# Patient Record
Sex: Female | Born: 1950
Health system: Southern US, Community
[De-identification: ages and names within clinical notes are randomized; demographics above are authoritative.]

## PROBLEM LIST (undated history)

## (undated) DIAGNOSIS — J45909 Unspecified asthma, uncomplicated: Secondary | ICD-10-CM

## (undated) HISTORY — PX: WRIST SURGERY: SHX841

## (undated) HISTORY — PX: FOOT SURGERY: SHX648

---

## 1997-09-13 ENCOUNTER — Other Ambulatory Visit: Admission: RE | Admit: 1997-09-13 | Discharge: 1997-09-13 | Payer: Self-pay | Admitting: Obstetrics & Gynecology

## 1999-04-13 ENCOUNTER — Other Ambulatory Visit: Admission: RE | Admit: 1999-04-13 | Discharge: 1999-04-13 | Payer: Self-pay | Admitting: Obstetrics and Gynecology

## 2001-10-16 ENCOUNTER — Other Ambulatory Visit: Admission: RE | Admit: 2001-10-16 | Discharge: 2001-10-16 | Payer: Self-pay | Admitting: Obstetrics and Gynecology

## 2002-10-15 ENCOUNTER — Ambulatory Visit (HOSPITAL_COMMUNITY): Admission: RE | Admit: 2002-10-15 | Discharge: 2002-10-15 | Payer: Self-pay | Admitting: Orthopedic Surgery

## 2002-10-15 ENCOUNTER — Encounter: Payer: Self-pay | Admitting: Orthopedic Surgery

## 2003-03-03 ENCOUNTER — Other Ambulatory Visit: Admission: RE | Admit: 2003-03-03 | Discharge: 2003-03-03 | Payer: Self-pay | Admitting: Obstetrics and Gynecology

## 2004-03-06 ENCOUNTER — Other Ambulatory Visit: Admission: RE | Admit: 2004-03-06 | Discharge: 2004-03-06 | Payer: Self-pay | Admitting: Obstetrics and Gynecology

## 2007-04-15 ENCOUNTER — Ambulatory Visit (HOSPITAL_BASED_OUTPATIENT_CLINIC_OR_DEPARTMENT_OTHER): Admission: RE | Admit: 2007-04-15 | Discharge: 2007-04-15 | Payer: Self-pay | Admitting: Orthopedic Surgery

## 2009-01-24 ENCOUNTER — Ambulatory Visit (HOSPITAL_BASED_OUTPATIENT_CLINIC_OR_DEPARTMENT_OTHER): Admission: RE | Admit: 2009-01-24 | Discharge: 2009-01-24 | Payer: Self-pay | Admitting: Orthopedic Surgery

## 2010-01-12 ENCOUNTER — Encounter: Admission: RE | Admit: 2010-01-12 | Discharge: 2010-01-12 | Payer: Self-pay | Admitting: Obstetrics and Gynecology

## 2010-06-13 LAB — POCT HEMOGLOBIN-HEMACUE: Hemoglobin: 13.8 g/dL (ref 12.0–15.0)

## 2010-07-24 NOTE — Op Note (Signed)
NAMEWAYNETTE, TOWERS NO.:  [id number]   MEDICAL RECORD NO.:  [id number]          PATIENT TYPE:  AMB   LOCATION:  DSC                          FACILITY:  [facility]   PHYSICIAN:  Cindee Salt, M.D.       DATE OF BIRTH:  03/04/1951   DATE OF PROCEDURE:  04/15/2007  DATE OF DISCHARGE:                               OPERATIVE REPORT   PREOPERATIVE DIAGNOSIS:  Distal radius fracture, comminuted intra-  articular, left wrist with carpal tunnel syndrome.   POSTOPERATIVE DIAGNOSIS:  Distal radius fracture, comminuted intra-  articular, left wrist with carpal tunnel syndrome.   OPERATION:  Open reduction internal fixation with Orthofix plate, carpal  tunnel release left hand.   SURGEON:  Cindee Salt, M.D.   ASSISTANT:  None.   ANESTHESIA:  Supraclavicular block, general anesthesia   ANESTHESIOLOGIST:  Sampson Goon.   HISTORY:  The patient is a 60 year old female who suffered a fall  fracture of the left distal radius.  She has dense numbness and tingling  of her fingers two days following the fracture.  She is desirous of  having this released along with open reduction internal fixation of  comminuted displaced intra-articular fracture of her left distal radius.  She is aware of risks and complications including infection, recurrence,  injury to arteries, nerves, tendons, incomplete relief of symptoms,  dystrophy, loss of fixation, penetration of the joint, nonunion, failure  of the fixation device.  In the preoperative area the patient is seen  and the extremity marked by both the patient and surgeon, questions  answered.  The antibiotic given.   PROCEDURE:  The patient is brought to the operating room.  Following a  supraclavicular block general anesthesia was given.  She was prepped  using DuraPrep, supine position, left arm free.  A volar radial incision  was made, carried down through subcutaneous tissue.  Bleeders were  electrocauterized.  Dissection carried down  between the flexor carpi  radialis, the radial artery.  The pronator quadratus was identified.  The fracture fragments were noted have penetrated through the pronator  quadratus were trapped by the muscle fibers.  The pronator was then  elevated.  A portion of the flexor pollicis longus was also elevated  from the radius.  The distal radius was identified and the fracture was  then manipulated reduced allowing interdigitation.  A standard sized  left Orthofix volar plate was then selected.  This was placed, pinned in  position.  X-rays confirmed positioning of the plate.  The sliding screw  hole was then made with 2.5 mm drill. A 12-mm screw was inserted after  measuring.  This fixed the plate in position.  The distal screws were  then placed.  The most ulnar screw was placed.  This was drilled with a  guide, measured found to be 18 to 20 mm.  This was then placed as a  nonlocking screw to bring the fracture down to the plate.  X-rays  confirmed that it was extra-articular, that it maintained the fracture  in position.  The remaining distal screws were each placed.  These were  all locking and measured between 18 and 20 mm.  These were placed.  X-  rays AP lateral and oblique revealed that none had penetrated into the  joint surface.  Fracture was maintained in position.  The remaining  proximal screws were then placed.  These were 3.5 mm screws and both  measured 12 mm.  X-rays confirmed position of plate, screws with  reduction of the fracture and maintenance of length and angle.  The  wound was irrigated.  Pronator quadratus was then repaired with figure-  of-eight 4-0 Vicryl stay sutures.  The brachial radialis had been  released with the Z-plasty from the radial styloid.  This was used to  reapproximate the pronator quadratus.  This was done after irrigation.  The subcutaneous tissue was then closed interrupted 4-0 Vicryl and skin  with interrupted 5-0 Vicryl Rapide sutures.  Separate  incision was then  made in the palm longitudinally carried down through subcutaneous  tissue.  Bleeders were electrocauterized, palmar fascia split,  superficial palmar arch identified, flexor tendon of the ring and little  finger identified to the ulnar side of median nerve.  The carpal  retinaculum was incised with sharp dissection.  A right-angle and Sewall  retractor placed through skin and forearm fascia.  The fascia released  for approximately a centimeter and half proximal to the wrist crease  under direct vision.  Canal was explored.  Area compression of the nerve  was apparent.  No further lesions were identified.  The wound was  irrigated.  The skin closed interrupted 5-0 Vicryl Rapide sutures.  Sterile compressive dressing dorsal palmar splint applied.  On Deflation  of tourniquet all fingers immediately pinked.  She was taken to the  recovery room for observation in satisfactory condition.  She will be  discharged home to return to [facility] in one week on  Vicodin.           ______________________________  Cindee Salt, M.D.     GK/MEDQ  D:  04/15/2007  T:  04/16/2007  Job:  [id number]

## 2010-11-30 LAB — POCT HEMOGLOBIN-HEMACUE: Hemoglobin: 13.5

## 2014-07-17 ENCOUNTER — Emergency Department (HOSPITAL_COMMUNITY)
Admission: EM | Admit: 2014-07-17 | Discharge: 2014-07-18 | Disposition: A | Discharge status: Home / Self Care | Payer: BLUE CROSS/BLUE SHIELD | Attending: Emergency Medicine | Admitting: Emergency Medicine

## 2014-07-17 ENCOUNTER — Emergency Department (HOSPITAL_COMMUNITY): Payer: BLUE CROSS/BLUE SHIELD

## 2014-07-17 ENCOUNTER — Encounter (HOSPITAL_COMMUNITY): Payer: Self-pay | Admitting: Emergency Medicine

## 2014-07-17 DIAGNOSIS — Y998 Other external cause status: Secondary | ICD-10-CM | POA: Insufficient documentation

## 2014-07-17 DIAGNOSIS — S8991XA Unspecified injury of right lower leg, initial encounter: Secondary | ICD-10-CM | POA: Insufficient documentation

## 2014-07-17 DIAGNOSIS — W109XXA Fall (on) (from) unspecified stairs and steps, initial encounter: Secondary | ICD-10-CM | POA: Diagnosis not present

## 2014-07-17 DIAGNOSIS — Y9301 Activity, walking, marching and hiking: Secondary | ICD-10-CM | POA: Insufficient documentation

## 2014-07-17 DIAGNOSIS — M25561 Pain in right knee: Secondary | ICD-10-CM

## 2014-07-17 DIAGNOSIS — Y92009 Unspecified place in unspecified non-institutional (private) residence as the place of occurrence of the external cause: Secondary | ICD-10-CM | POA: Insufficient documentation

## 2014-07-17 DIAGNOSIS — Z880 Allergy status to penicillin: Secondary | ICD-10-CM | POA: Diagnosis not present

## 2014-07-17 DIAGNOSIS — G8929 Other chronic pain: Secondary | ICD-10-CM | POA: Diagnosis not present

## 2014-07-17 DIAGNOSIS — J45909 Unspecified asthma, uncomplicated: Secondary | ICD-10-CM | POA: Insufficient documentation

## 2014-07-17 HISTORY — DX: Unspecified asthma, uncomplicated: J45.909

## 2014-07-17 MED ORDER — OXYCODONE-ACETAMINOPHEN 5-325 MG PO TABS
2.0000 | ORAL_TABLET | Freq: Once | ORAL | Status: AC
  Administered 2014-07-17: 2 via ORAL
  Filled 2014-07-17: qty 2

## 2014-07-17 MED ORDER — OXYCODONE-ACETAMINOPHEN 5-325 MG PO TABS: 2.0000 | ORAL_TABLET | Freq: Four times a day (QID) | ORAL | Status: AC | PRN

## 2014-07-17 MED ORDER — ENOXAPARIN SODIUM 80 MG/0.8ML ~~LOC~~ SOLN
1.0000 mg/kg | Freq: Once | SUBCUTANEOUS | Status: AC
  Administered 2014-07-17: 80 mg via SUBCUTANEOUS
  Filled 2014-07-17: qty 0.8

## 2014-07-17 NOTE — Discharge Instructions (Signed)
Knee Pain °The knee is the complex joint between your thigh and your lower leg. It is made up of bones, tendons, ligaments, and cartilage. The bones that make up the knee are: °· The femur in the thigh. °· The tibia and fibula in the lower leg. °· The patella or kneecap riding in the groove on the lower femur. °CAUSES  °Knee pain is a common complaint with many causes. A few of these causes are: °· Injury, such as: °¨ A ruptured ligament or tendon injury. °¨ Torn cartilage. °· Medical conditions, such as: °¨ Gout °¨ Arthritis °¨ Infections °· Overuse, over training, or overdoing a physical activity. °Knee pain can be minor or severe. Knee pain can accompany debilitating injury. Minor knee problems often respond well to self-care measures or get well on their own. More serious injuries may need medical intervention or even surgery. °SYMPTOMS °The knee is complex. Symptoms of knee problems can vary widely. Some of the problems are: °· Pain with movement and weight bearing. °· Swelling and tenderness. °· Buckling of the knee. °· Inability to straighten or extend your knee. °· Your knee locks and you cannot straighten it. °· Warmth and redness with pain and fever. °· Deformity or dislocation of the kneecap. °DIAGNOSIS  °Determining what is wrong may be very straight forward such as when there is an injury. It can also be challenging because of the complexity of the knee. Tests to make a diagnosis may include: °· Your caregiver taking a history and doing a physical exam. °· Routine X-rays can be used to rule out other problems. X-rays will not reveal a cartilage tear. Some injuries of the knee can be diagnosed by: °¨ Arthroscopy a surgical technique by which a small video camera is inserted through tiny incisions on the sides of the knee. This procedure is used to examine and repair internal knee joint problems. Tiny instruments can be used during arthroscopy to repair the torn knee cartilage (meniscus). °¨ Arthrography  is a radiology technique. A contrast liquid is directly injected into the knee joint. Internal structures of the knee joint then become visible on X-ray film. °¨ An MRI scan is a non X-ray radiology procedure in which magnetic fields and a computer produce two- or three-dimensional images of the inside of the knee. Cartilage tears are often visible using an MRI scanner. MRI scans have largely replaced arthrography in diagnosing cartilage tears of the knee. °· Blood work. °· Examination of the fluid that helps to lubricate the knee joint (synovial fluid). This is done by taking a sample out using a needle and a syringe. °TREATMENT °The treatment of knee problems depends on the cause. Some of these treatments are: °· Depending on the injury, proper casting, splinting, surgery, or physical therapy care will be needed. °· Give yourself adequate recovery time. Do not overuse your joints. If you begin to get sore during workout routines, back off. Slow down or do fewer repetitions. °· For repetitive activities such as cycling or running, maintain your strength and nutrition. °· Alternate muscle groups. For example, if you are a weight lifter, work the upper body on one day and the lower body the next. °· Either tight or weak muscles do not give the proper support for your knee. Tight or weak muscles do not absorb the stress placed on the knee joint. Keep the muscles surrounding the knee strong. °· Take care of mechanical problems. °¨ If you have flat feet, orthotics or special shoes may help.   See your caregiver if you need help. °¨ Arch supports, sometimes with wedges on the inner or outer aspect of the heel, can help. These can shift pressure away from the side of the knee most bothered by osteoarthritis. °¨ A brace called an "unloader" brace also may be used to help ease the pressure on the most arthritic side of the knee. °· If your caregiver has prescribed crutches, braces, wraps or ice, use as directed. The acronym  for this is PRICE. This means protection, rest, ice, compression, and elevation. °· Nonsteroidal anti-inflammatory drugs (NSAIDs), can help relieve pain. But if taken immediately after an injury, they may actually increase swelling. Take NSAIDs with food in your stomach. Stop them if you develop stomach problems. Do not take these if you have a history of ulcers, stomach pain, or bleeding from the bowel. Do not take without your caregiver's approval if you have problems with fluid retention, heart failure, or kidney problems. °· For ongoing knee problems, physical therapy may be helpful. °· Glucosamine and chondroitin are over-the-counter dietary supplements. Both may help relieve the pain of osteoarthritis in the knee. These medicines are different from the usual anti-inflammatory drugs. Glucosamine may decrease the rate of cartilage destruction. °· Injections of a corticosteroid drug into your knee joint may help reduce the symptoms of an arthritis flare-up. They may provide pain relief that lasts a few months. You may have to wait a few months between injections. The injections do have a small increased risk of infection, water retention, and elevated blood sugar levels. °· Hyaluronic acid injected into damaged joints may ease pain and provide lubrication. These injections may work by reducing inflammation. A series of shots may give relief for as long as 6 months. °· Topical painkillers. Applying certain ointments to your skin may help relieve the pain and stiffness of osteoarthritis. Ask your pharmacist for suggestions. Many over the-counter products are approved for temporary relief of arthritis pain. °· In some countries, doctors often prescribe topical NSAIDs for relief of chronic conditions such as arthritis and tendinitis. A review of treatment with NSAID creams found that they worked as well as oral medications but without the serious side effects. °PREVENTION °· Maintain a healthy weight. Extra pounds  put more strain on your joints. °· Get strong, stay limber. Weak muscles are a common cause of knee injuries. Stretching is important. Include flexibility exercises in your workouts. °· Be smart about exercise. If you have osteoarthritis, chronic knee pain or recurring injuries, you may need to change the way you exercise. This does not mean you have to stop being active. If your knees ache after jogging or playing basketball, consider switching to swimming, water aerobics, or other low-impact activities, at least for a few days a week. Sometimes limiting high-impact activities will provide relief. °· Make sure your shoes fit well. Choose footwear that is right for your sport. °· Protect your knees. Use the proper gear for knee-sensitive activities. Use kneepads when playing volleyball or laying carpet. Buckle your seat belt every time you drive. Most shattered kneecaps occur in car accidents. °· Rest when you are tired. °SEEK MEDICAL CARE IF:  °You have knee pain that is continual and does not seem to be getting better.  °SEEK IMMEDIATE MEDICAL CARE IF:  °Your knee joint feels hot to the touch and you have a high fever. °MAKE SURE YOU:  °· Understand these instructions. °· Will watch your condition. °· Will get help right away if you are not   doing well or get worse. Document Released: 12/23/2006 Document Revised: 05/20/2011 Document Reviewed: 12/23/2006 [facility] Patient Information 2015 [place], [place]. This information is not intended to replace advice given to you by your health care provider. Make sure you discuss any questions you have with your health care provider. Deep Vein Thrombosis A deep vein thrombosis (DVT) is a blood clot that develops in the deep, larger veins of the leg, arm, or pelvis. These are more dangerous than clots that might form in veins near the surface of the body. A DVT can lead to serious and even life-threatening complications if the clot breaks off and travels in the bloodstream  to the lungs.  A DVT can damage the valves in your leg veins so that instead of flowing upward, the blood pools in the lower leg. This is called post-thrombotic syndrome, and it can result in pain, swelling, discoloration, and sores on the leg. CAUSES Usually, several things contribute to the formation of blood clots. Contributing factors include:  The flow of blood slows down.  The inside of the vein is damaged in some way.  You have a condition that makes blood clot more easily. RISK FACTORS Some people are more likely than others to develop blood clots. Risk factors include:   Smoking.  Being overweight (obese).  Sitting or lying still for a long time. This includes long-distance travel, paralysis, or recovery from an illness or surgery. Other factors that increase risk are:   Older age, especially over 63 years of age.  Having a family history of blood clots or if you have already had a blot clot.  Having major or lengthy surgery. This is especially true for surgery on the hip, knee, or belly (abdomen). Hip surgery is particularly high risk.  Having a long, thin tube (catheter) placed inside a vein during a medical procedure.  Breaking a hip or leg.  Having cancer or cancer treatment.  Pregnancy and childbirth.  Hormone changes make the blood clot more easily during pregnancy.  The fetus puts pressure on the veins of the pelvis.  There is a risk of injury to veins during delivery or a caesarean delivery. The risk is highest just after childbirth.  Medicines containing the female hormone estrogen. This includes birth control pills and hormone replacement therapy.  Other circulation or heart problems.  SIGNS AND SYMPTOMS When a clot forms, it can either partially or totally block the blood flow in that vein. Symptoms of a DVT can include:  Swelling of the leg or arm, especially if one side is much worse.  Warmth and redness of the leg or arm, especially if one side  is much worse.  Pain in an arm or leg. If the clot is in the leg, symptoms may be more noticeable or worse when standing or walking. The symptoms of a DVT that has traveled to the lungs (pulmonary embolism, PE) usually start suddenly and include:  Shortness of breath.  Coughing.  Coughing up blood or blood-tinged mucus.  Chest pain. The chest pain is often worse with deep breaths.  Rapid heartbeat. Anyone with these symptoms should get emergency medical treatment right away. Do not wait to see if the symptoms will go away. Call your local emergency services (911 in the [place] if you have these symptoms. Do not drive yourself to the hospital. DIAGNOSIS If a DVT is suspected, your health care provider will take a full medical history and perform a physical exam. Tests that also may be required include:  Blood tests, including studies of the clotting properties of the blood.  Ultrasound to see if you have clots in your legs or lungs.  X-rays to show the flow of blood when dye is injected into the veins (venogram).  Studies of your lungs if you have any chest symptoms. PREVENTION  Exercise the legs regularly. Take a brisk 30-minute walk every day.  Maintain a weight that is appropriate for your height.  Avoid sitting or lying in bed for long periods of time without moving your legs.  Women, particularly those over the age of 31 years, should consider the risks and benefits of taking estrogen medicines, including birth control pills.  Do not smoke, especially if you take estrogen medicines.  Long-distance travel can increase your risk of DVT. You should exercise your legs by walking or pumping the muscles every hour.  Many of the risk factors above relate to situations that exist with hospitalization, either for illness, injury, or elective surgery. Prevention may include medical and nonmedical measures.  Your health care provider will assess you for the need for venous  thromboembolism prevention when you are admitted to the hospital. If you are having surgery, your surgeon will assess you the day of or day after surgery. TREATMENT Once identified, a DVT can be treated. It can also be prevented in some circumstances. Once you have had a DVT, you may be at increased risk for a DVT in the future. The most common treatment for DVT is blood-thinning (anticoagulant) medicine, which reduces the blood's tendency to clot. Anticoagulants can stop new blood clots from forming and stop old clots from growing. They cannot dissolve existing clots. Your body does this by itself over time. Anticoagulants can be given by mouth, through an IV tube, or by injection. Your health care provider will determine the best program for you. Other medicines or treatments that may be used are:  Heparin or related medicines (low molecular weight heparin) are often the first treatment for a blood clot. They act quickly. However, they cannot be taken orally and must be given either in shot form or by IV tube.  Heparin can cause a fall in a component of blood that stops bleeding and forms blood clots (platelets). You will be monitored with blood tests to be sure this does not occur.  Warfarin is an anticoagulant that can be swallowed. It takes a few days to start working, so usually heparin or related medicines are used in combination. Once warfarin is working, heparin is usually stopped.  Factor Xa inhibitor medicines, such as rivaroxaban and apixaban, also reduce blood clotting. These medicines are taken orally and can often be used without heparin or related medicines.  Less commonly, clot dissolving drugs (thrombolytics) are used to dissolve a DVT. They carry a high risk of bleeding, so they are used mainly in severe cases where your life or a part of your body is threatened.  Very rarely, a blood clot in the leg needs to be removed surgically.  If you are unable to take anticoagulants, your  health care provider may arrange for you to have a filter placed in a main vein in your abdomen. This filter prevents clots from traveling to your lungs. HOME CARE INSTRUCTIONS  Take all medicines as directed by your health care provider.  Learn as much as you can about DVT.  Wear a medical alert bracelet or carry a medical alert card.  Ask your health care provider how soon you can go back to  normal activities. It is important to stay active to prevent blood clots. If you are on anticoagulant medicine, avoid contact sports.  It is very important to exercise. This is especially important while traveling, sitting, or standing for long periods of time. Exercise your legs by walking or by tightening and relaxing your leg muscles regularly. Take frequent walks.  You may need to wear compression stockings. These are tight elastic stockings that apply pressure to the lower legs. This pressure can help keep the blood in the legs from clotting. Taking Warfarin Warfarin is a daily medicine that is taken by mouth. Your health care provider will advise you on the length of treatment (usually 3-6 months, sometimes lifelong). If you take warfarin:  Understand how to take warfarin and foods that can affect how warfarin works in Veterinary surgeon.  Too much and too little warfarin are both dangerous. Too much warfarin increases the risk of bleeding. Too little warfarin continues to allow the risk for blood clots. Warfarin and Regular Blood Testing While taking warfarin, you will need to have regular blood tests to measure your blood clotting time. These blood tests usually include both the prothrombin time (PT) and international normalized ratio (INR) tests. The PT and INR results allow your health care provider to adjust your dose of warfarin. It is very important that you have your PT and INR tested as often as directed by your health care provider.  Warfarin and Your Diet Avoid major changes in your diet, or  notify your health care provider before changing your diet. Arrange a visit with a registered dietitian to answer your questions. Many foods, especially foods high in vitamin K, can interfere with warfarin and affect the PT and INR results. You should eat a consistent amount of foods high in vitamin K. Foods high in vitamin K include:   Spinach, kale, broccoli, cabbage, collard and turnip greens, Brussels sprouts, peas, cauliflower, seaweed, and parsley.  Beef and pork liver.  Green tea.  Soybean oil. Warfarin with Other Medicines Many medicines can interfere with warfarin and affect the PT and INR results. You must:  Tell your health care provider about any and all medicines, vitamins, and supplements you take, including aspirin and other over-the-counter anti-inflammatory medicines. Be especially cautious with aspirin and anti-inflammatory medicines. Ask your health care provider before taking these.  Do not take or discontinue any prescribed or over-the-counter medicine except on the advice of your health care provider or pharmacist. Warfarin Side Effects Warfarin can have side effects, such as easy bruising and difficulty stopping bleeding. Ask your health care provider or pharmacist about other side effects of warfarin. You will need to:  Hold pressure over cuts for longer than usual.  Notify your dentist and other health care providers that you are taking warfarin before you undergo any procedures where bleeding may occur. Warfarin with Alcohol and Tobacco   Drinking alcohol frequently can increase the effect of warfarin, leading to excess bleeding. It is best to avoid alcoholic drinks or to consume only very small amounts while taking warfarin. Notify your health care provider if you change your alcohol intake.   Do not use any tobacco products including cigarettes, chewing tobacco, or electronic cigarettes. If you smoke, quit. Ask your health care provider for help with quitting  smoking. Alternative Medicines to Warfarin: Factor Xa Inhibitor Medicines  These blood-thinning medicines are taken by mouth, usually for several weeks or longer. It is important to take the medicine every single day at the  same time each day.  There are no regular blood tests required when using these medicines.  There are fewer food and drug interactions than with warfarin.  The side effects of this class of medicine are similar to those of warfarin, including excessive bruising or bleeding. Ask your health care provider or pharmacist about other potential side effects. SEEK MEDICAL CARE IF:  You notice a rapid heartbeat.  You feel weaker or more tired than usual.  You feel faint.  You notice increased bruising.  You feel your symptoms are not getting better in the time expected.  You believe you are having side effects of medicine. SEEK IMMEDIATE MEDICAL CARE IF:  You have chest pain.  You have trouble breathing.  You have new or increased swelling or pain in one leg.  You cough up blood.  You notice blood in vomit, in a bowel movement, or in urine. MAKE SURE YOU:  Understand these instructions.  Will watch your condition.  Will get help right away if you are not doing well or get worse. Document Released: 02/25/2005 Document Revised: 07/12/2013 Document Reviewed: 11/02/2012 [facility] Patient Information 2015 [place], [place]. This information is not intended to replace advice given to you by your health care provider. Make sure you discuss any questions you have with your health care provider.

## 2014-07-17 NOTE — ED Notes (Signed)
Pt. reports chronic right knee pain ( uses a walker ) worse this evening after she missed her step and almost fell at home this evening , presents with posterior right knee pain radiating to lower leg , pain increases with movement /weight bearing.

## 2014-07-17 NOTE — ED Provider Notes (Signed)
CSN: [id number]     Arrival date & time 07/17/14  2206 History  This chart was scribed for non-physician practitioner Lorre Munroe, PA, working with Malvin Johns, MD, by Eustaquio Maize, ED Scribe. This patient was seen in room E47C/E47C and the patient's care was started at 11:02 PM.    Chief Complaint  Patient presents with  . Knee Pain   The history is provided by the patient. No language interpreter was used.     HPI Comments: Alexis Tate is a 64 y.o. female with hx chronic right knee pain who presents to the Emergency Department complaining of right knee that began 1 month ago, exacerbated today.  This evening pt was walking up stairs and fell, causing increased pain to the area. She mentions that her right leg has been swollen for some time but she notes increased swelling since the fall. Pt was seen at Urgent Care for knee pain and swelling in the past. She has also seen in orthopedic, Dr. Huntley Estelle the pain and received cortisone shot. She mentions that she has had to walk with a cane since getting the shot. Pt has not had an MRI of her knee. Her orthopedic believes this is the next step. Pt is on her feet all day at work walking on cement floors. She mentions that she had a tick bite to her left leg and is concerned that her symptoms could be related to to Lyme disease. Fhx DVTs with pt's mother. She denies fever, chills, weakness or numbness, or any other symptoms.    Past Medical History  Diagnosis Date  . Asthma    Past Surgical History  Procedure Laterality Date  . Wrist surgery    . Foot surgery     No family history on file. History  Substance Use Topics  . Smoking status: Never Smoker   . Smokeless tobacco: Not on file  . Alcohol Use: No   OB History    No data available     Review of Systems  Constitutional: Negative for fever and chills.  Respiratory: Negative for cough and shortness of breath.   Cardiovascular: Negative for chest pain.  Gastrointestinal:  Negative for nausea and vomiting.  Musculoskeletal: Positive for joint swelling (Right lower extremity swelling. ), arthralgias (Right knee pain. ) and gait problem. Negative for myalgias.  Skin: Negative for color change.  Neurological: Negative for syncope, weakness and numbness.  Psychiatric/Behavioral: Negative for confusion.      Allergies  Amoxicillin; Erythromycin; and Penicillins  Home Medications   Prior to Admission medications   Not on File   Triage Vitals: BP 169/74 mmHg  Temp(Src) 99.5 F (37.5 C) (Oral)  Resp 14  Ht 5\' 2"  (1.575 m)  Wt 180 lb (81.647 kg)  BMI 32.91 kg/m2  SpO2 99%   Physical Exam  Constitutional: She is oriented to person, place, and time. She appears well-developed and well-nourished. No distress.  HENT:  Head: Normocephalic and atraumatic.  Eyes: Conjunctivae and EOM are normal.  Neck: Neck supple. No tracheal deviation present.  Cardiovascular: Normal rate.   Pulmonary/Chest: Effort normal. No respiratory distress.  Musculoskeletal: Normal range of motion.  Right knee mildly tender to palpation, moderately swollen, no bony abnormality or deformity, moderate calf tenderness, no erythema, no evidence of septic joint  Neurological: She is alert and oriented to person, place, and time.  Skin: Skin is warm and dry.  Psychiatric: She has a normal mood and affect. Her behavior is normal.  Nursing note  and vitals reviewed.   ED Course  Procedures (including critical care time)  DIAGNOSTIC STUDIES: Oxygen Saturation is 99% on RA, normal by my interpretation.    COORDINATION OF CARE: 11:09 PM-Discussed treatment plan which includes ultrasound in the morning and Lovenox in the ED with pt at bedside and pt agreed to plan.   Labs Review Labs Reviewed - No data to display  Imaging Review No results found.   EKG Interpretation None      MDM   Final diagnoses:  Knee pain, chronic, right   Patient with acute on chronic right knee  pain. No obvious deformity or abnormality, mildly swollen. Some calf tenderness. Some concern for DVT. Family history of DVT. Will give Lovenox, and recommend ultrasound morning. Plain films are negative. Ultrasound is negative, recommend orthopedic follow-up. Patient understands and agrees with the plan. She is stable for discharge.  I personally performed the services described in this documentation, which was scribed in my presence. The recorded information has been reviewed and is accurate.      Montine Circle, PA-C 07/17/14 [id number]  Malvin Johns, MD 07/17/14 [phone]

## 2014-07-18 ENCOUNTER — Ambulatory Visit (HOSPITAL_COMMUNITY)
Admission: RE | Admit: 2014-07-18 | Discharge: 2014-07-18 | Disposition: A | Discharge status: Home / Self Care | Payer: BLUE CROSS/BLUE SHIELD | Source: Ambulatory Visit | Attending: Emergency Medicine | Admitting: Emergency Medicine

## 2014-07-18 DIAGNOSIS — M79609 Pain in unspecified limb: Secondary | ICD-10-CM

## 2014-07-18 DIAGNOSIS — M79604 Pain in right leg: Secondary | ICD-10-CM | POA: Diagnosis present

## 2017-06-18 DIAGNOSIS — J309 Allergic rhinitis, unspecified: Secondary | ICD-10-CM | POA: Diagnosis not present

## 2017-06-18 DIAGNOSIS — J019 Acute sinusitis, unspecified: Secondary | ICD-10-CM | POA: Diagnosis not present

## 2017-06-18 DIAGNOSIS — Z6834 Body mass index (BMI) 34.0-34.9, adult: Secondary | ICD-10-CM | POA: Diagnosis not present

## 2017-10-13 DIAGNOSIS — N3941 Urge incontinence: Secondary | ICD-10-CM | POA: Diagnosis not present

## 2017-10-13 DIAGNOSIS — N904 Leukoplakia of vulva: Secondary | ICD-10-CM | POA: Diagnosis not present

## 2017-11-12 DIAGNOSIS — N941 Unspecified dyspareunia: Secondary | ICD-10-CM | POA: Diagnosis not present

## 2017-11-12 DIAGNOSIS — L900 Lichen sclerosus et atrophicus: Secondary | ICD-10-CM | POA: Diagnosis not present

## 2017-11-12 DIAGNOSIS — N393 Stress incontinence (female) (male): Secondary | ICD-10-CM | POA: Diagnosis not present

## 2018-02-25 DIAGNOSIS — Z01419 Encounter for gynecological examination (general) (routine) without abnormal findings: Secondary | ICD-10-CM | POA: Diagnosis not present

## 2018-02-25 DIAGNOSIS — Z1231 Encounter for screening mammogram for malignant neoplasm of breast: Secondary | ICD-10-CM | POA: Diagnosis not present

## 2018-02-26 ENCOUNTER — Other Ambulatory Visit: Payer: Self-pay | Admitting: Obstetrics

## 2018-02-26 DIAGNOSIS — R928 Other abnormal and inconclusive findings on diagnostic imaging of breast: Secondary | ICD-10-CM

## 2018-03-06 ENCOUNTER — Ambulatory Visit
Admission: RE | Admit: 2018-03-06 | Discharge: 2018-03-06 | Disposition: A | Discharge status: Home / Self Care | Payer: BLUE CROSS/BLUE SHIELD | Source: Ambulatory Visit | Attending: Obstetrics | Admitting: Obstetrics

## 2018-03-06 ENCOUNTER — Ambulatory Visit
Admission: RE | Admit: 2018-03-06 | Discharge: 2018-03-06 | Disposition: A | Discharge status: Home / Self Care | Payer: Medicare HMO | Source: Ambulatory Visit | Attending: Obstetrics | Admitting: Obstetrics

## 2018-03-06 ENCOUNTER — Other Ambulatory Visit: Payer: Self-pay | Admitting: Obstetrics

## 2018-03-06 DIAGNOSIS — R928 Other abnormal and inconclusive findings on diagnostic imaging of breast: Secondary | ICD-10-CM

## 2018-03-06 DIAGNOSIS — N6323 Unspecified lump in the left breast, lower outer quadrant: Secondary | ICD-10-CM | POA: Diagnosis not present

## 2018-03-06 DIAGNOSIS — N6324 Unspecified lump in the left breast, lower inner quadrant: Secondary | ICD-10-CM | POA: Diagnosis not present

## 2018-03-06 DIAGNOSIS — N6320 Unspecified lump in the left breast, unspecified quadrant: Secondary | ICD-10-CM

## 2018-03-09 DIAGNOSIS — J45909 Unspecified asthma, uncomplicated: Secondary | ICD-10-CM | POA: Diagnosis not present

## 2018-03-09 DIAGNOSIS — J069 Acute upper respiratory infection, unspecified: Secondary | ICD-10-CM | POA: Diagnosis not present

## 2018-03-09 DIAGNOSIS — R05 Cough: Secondary | ICD-10-CM | POA: Diagnosis not present

## 2018-09-07 ENCOUNTER — Other Ambulatory Visit: Payer: Self-pay | Admitting: Obstetrics

## 2018-09-07 ENCOUNTER — Other Ambulatory Visit: Payer: Self-pay

## 2018-09-07 ENCOUNTER — Ambulatory Visit
Admission: RE | Admit: 2018-09-07 | Discharge: 2018-09-07 | Disposition: A | Discharge status: Home / Self Care | Payer: PPO | Source: Ambulatory Visit | Attending: Obstetrics | Admitting: Obstetrics

## 2018-09-07 ENCOUNTER — Ambulatory Visit
Admission: RE | Admit: 2018-09-07 | Discharge: 2018-09-07 | Disposition: A | Discharge status: Home / Self Care | Payer: Medicare HMO | Source: Ambulatory Visit | Attending: Obstetrics | Admitting: Obstetrics

## 2018-09-07 DIAGNOSIS — N6323 Unspecified lump in the left breast, lower outer quadrant: Secondary | ICD-10-CM | POA: Diagnosis not present

## 2018-09-07 DIAGNOSIS — N63 Unspecified lump in unspecified breast: Secondary | ICD-10-CM

## 2018-09-07 DIAGNOSIS — N6320 Unspecified lump in the left breast, unspecified quadrant: Secondary | ICD-10-CM

## 2018-09-07 DIAGNOSIS — R928 Other abnormal and inconclusive findings on diagnostic imaging of breast: Secondary | ICD-10-CM | POA: Diagnosis not present

## 2018-09-07 DIAGNOSIS — N6324 Unspecified lump in the left breast, lower inner quadrant: Secondary | ICD-10-CM | POA: Diagnosis not present

## 2019-03-10 ENCOUNTER — Ambulatory Visit
Admission: RE | Admit: 2019-03-10 | Discharge: 2019-03-10 | Disposition: A | Discharge status: Home / Self Care | Payer: PPO | Source: Ambulatory Visit | Attending: Obstetrics | Admitting: Obstetrics

## 2019-03-10 ENCOUNTER — Other Ambulatory Visit: Payer: Self-pay

## 2019-03-10 DIAGNOSIS — N6324 Unspecified lump in the left breast, lower inner quadrant: Secondary | ICD-10-CM | POA: Diagnosis not present

## 2019-03-10 DIAGNOSIS — N63 Unspecified lump in unspecified breast: Secondary | ICD-10-CM

## 2019-03-10 DIAGNOSIS — R928 Other abnormal and inconclusive findings on diagnostic imaging of breast: Secondary | ICD-10-CM | POA: Diagnosis not present

## 2019-03-10 DIAGNOSIS — N6323 Unspecified lump in the left breast, lower outer quadrant: Secondary | ICD-10-CM | POA: Diagnosis not present

## 2019-03-24 DIAGNOSIS — L900 Lichen sclerosus et atrophicus: Secondary | ICD-10-CM | POA: Diagnosis not present

## 2019-03-24 DIAGNOSIS — N3941 Urge incontinence: Secondary | ICD-10-CM | POA: Diagnosis not present

## 2019-05-17 DIAGNOSIS — M62552 Muscle wasting and atrophy, not elsewhere classified, left thigh: Secondary | ICD-10-CM | POA: Diagnosis not present

## 2019-05-17 DIAGNOSIS — M7062 Trochanteric bursitis, left hip: Secondary | ICD-10-CM | POA: Diagnosis not present

## 2019-05-17 DIAGNOSIS — R2689 Other abnormalities of gait and mobility: Secondary | ICD-10-CM | POA: Diagnosis not present

## 2019-05-21 DIAGNOSIS — M62552 Muscle wasting and atrophy, not elsewhere classified, left thigh: Secondary | ICD-10-CM | POA: Diagnosis not present

## 2019-05-21 DIAGNOSIS — R2689 Other abnormalities of gait and mobility: Secondary | ICD-10-CM | POA: Diagnosis not present

## 2019-05-21 DIAGNOSIS — M7062 Trochanteric bursitis, left hip: Secondary | ICD-10-CM | POA: Diagnosis not present

## 2019-05-24 DIAGNOSIS — R2689 Other abnormalities of gait and mobility: Secondary | ICD-10-CM | POA: Diagnosis not present

## 2019-05-24 DIAGNOSIS — M7062 Trochanteric bursitis, left hip: Secondary | ICD-10-CM | POA: Diagnosis not present

## 2019-05-24 DIAGNOSIS — M62552 Muscle wasting and atrophy, not elsewhere classified, left thigh: Secondary | ICD-10-CM | POA: Diagnosis not present

## 2019-05-26 DIAGNOSIS — M7062 Trochanteric bursitis, left hip: Secondary | ICD-10-CM | POA: Diagnosis not present

## 2019-05-26 DIAGNOSIS — R2689 Other abnormalities of gait and mobility: Secondary | ICD-10-CM | POA: Diagnosis not present

## 2019-05-26 DIAGNOSIS — M62552 Muscle wasting and atrophy, not elsewhere classified, left thigh: Secondary | ICD-10-CM | POA: Diagnosis not present

## 2019-05-28 DIAGNOSIS — H2513 Age-related nuclear cataract, bilateral: Secondary | ICD-10-CM | POA: Diagnosis not present

## 2019-05-31 DIAGNOSIS — M7062 Trochanteric bursitis, left hip: Secondary | ICD-10-CM | POA: Diagnosis not present

## 2019-05-31 DIAGNOSIS — R2689 Other abnormalities of gait and mobility: Secondary | ICD-10-CM | POA: Diagnosis not present

## 2019-05-31 DIAGNOSIS — M62552 Muscle wasting and atrophy, not elsewhere classified, left thigh: Secondary | ICD-10-CM | POA: Diagnosis not present

## 2019-06-02 NOTE — Progress Notes (Signed)
[facility] Note  06/09/2019     CHIEF COMPLAINT Patient presents for Retina Evaluation  HISTORY OF PRESENT ILLNESS: Alexis Tate is a 69 y.o. female who presents to the clinic today for:   HPI    Retina Evaluation    In left eye.  This started 1 month ago.  Duration of 1 month.  Associated Symptoms Floaters.  Negative for Flashes, Distortion, Blind Spot, Pain, Redness, Photophobia, Glare, Trauma, Scalp Tenderness, Jaw Claudication, Shoulder/Hip pain, Fever, Weight Loss and Fatigue.  Context:  mid-range vision, near vision and reading.  Treatments tried include no treatments.  I, the attending physician,  performed the HPI with the patient and updated documentation appropriately.          Comments    Patient states difficulty reading small print, especially with OS.        Last edited by Bernarda Caffey, MD on 06/09/2019 12:27 PM. (History)    Pt sees Dr. Hulan Saas for routine eye exams and saw Dr. Hulan Saas a couple of weeks ago.  Pt complains of halo while looking at objects in the left eye--says she was looking at the moon and noticed ring around the moon OS but not OD.  Referring physician: Valente David, [place],  [place] [address]  HISTORICAL INFORMATION:   Selected notes from the MEDICAL RECORD NUMBER Referred by Dr. Hulan Saas for concern of mac hole OS   CURRENT MEDICATIONS: No current outpatient medications on file. (Ophthalmic Drugs)   No current facility-administered medications for this visit. (Ophthalmic Drugs)   Current Outpatient Medications (Other)  Medication Sig  . ALPRAZolam (XANAX) 0.5 MG tablet Take 0.5 mg by mouth as needed.  . mirabegron ER (MYRBETRIQ) 25 MG TB24 tablet Take 25 mg by mouth daily.  Marland Kitchen triamcinolone cream (KENALOG) 0.1 % Apply 1 application topically. Twice a week  . oxyCODONE-acetaminophen (PERCOCET/ROXICET) 5-325 MG per tablet Take 2 tablets by mouth every 6 (six) hours as needed for severe pain.  (Patient not taking: Reported on 06/09/2019)   No current facility-administered medications for this visit. (Other)      REVIEW OF SYSTEMS: ROS    Positive for: Eyes   Negative for: Constitutional, Gastrointestinal, Neurological, Skin, Genitourinary, Musculoskeletal, HENT, Endocrine, Cardiovascular, Respiratory, Psychiatric, Allergic/Imm, Heme/Lymph   Last edited by Roselee Nova D, COT on 06/09/2019  8:20 AM. (History)       ALLERGIES Allergies  Allergen Reactions  . Amoxicillin   . Erythromycin   . Penicillins     PAST MEDICAL HISTORY Past Medical History:  Diagnosis Date  . Asthma    Past Surgical History:  Procedure Laterality Date  . FOOT SURGERY    . WRIST SURGERY      FAMILY HISTORY Family History  Problem Relation Age of Onset  . Diabetes Mother   . Diabetes Maternal Aunt     SOCIAL HISTORY Social History   Tobacco Use  . Smoking status: Never Smoker  . Smokeless tobacco: Never Used  Substance Use Topics  . Alcohol use: No  . Drug use: No         OPHTHALMIC EXAM:  Base Eye Exam    Visual Acuity (Snellen - Linear)      Right Left   Dist cc 20/20 -2 20/40 +2   Dist ph cc 20/20 NI       Tonometry (Tonopen, 8:31 AM)      Right Left   Pressure 14 14  Pupils      Dark Light Shape React APD   Right 3 2 Round Brisk None   Left 3 2 Round Brisk None       Visual Fields (Counting fingers)      Left Right    Full Full       Extraocular Movement      Right Left    Full, Ortho Full, Ortho       Dilation    Both eyes: 1.0% Mydriacyl, 2.5% Phenylephrine @ 8:31 AM        Slit Lamp and Fundus Exam    Slit Lamp Exam      Right Left   Lids/Lashes Dermatochalasis - upper lid, mild Meibomian gland dysfunction Dermatochalasis - upper lid, mild Meibomian gland dysfunction   Conjunctiva/Sclera White and quiet White and quiet   Cornea trace arcus, trace PEE, mild tear film debris trace arcus, trace PEE, mild tear film debris   Anterior  Chamber Deep and quiet Deep and quiet, no cell or flare   Iris Round and dilated Round and dilated   Lens 2+Nuclear sclerosis, 2+ Cortical 2+Nuclear sclerosis, 2+ Cortical   Vitreous Vitreous syneresis Vitreous syneresis       Fundus Exam      Right Left   Disc Pink and sharp Pink and sharp, compact   C/D Ratio 0.2 0.2   Macula Flat, Blunted foveal reflex, Mottling Blunted foveal reflex, 3+ERM, +Central VMT with cystic changes   Vessels Vascular attenuation, Tortuous Vascular attenuation, Tortuous   Periphery Attached, mild reticular degeneration, no heme Attached, mild reticular degeneration, no heme        Refraction    Wearing Rx      Sphere Cylinder Axis Add   Right +0.50 +0.75 180 +2.50   Left [place] +0.75 180 +2.50       Manifest Refraction      Sphere Cylinder Axis Dist VA   Right +1.25 +1.00 180 20/20   Left [place] +1.00 160 20/30-2          IMAGING AND PROCEDURES  Imaging and Procedures for _0 @  OCT, Retina - OU - Both Eyes       Right Eye Quality was good. Central Foveal Thickness: 296. Progression has no prior data. Findings include normal foveal contour, no IRF, no SRF, vitreomacular adhesion .   Left Eye Quality was good. Central Foveal Thickness: 423. Progression has no prior data. Findings include abnormal foveal contour, vitreous traction, epiretinal membrane, no IRF, no SRF (Central cystic changes ? foveoschisis).   Notes *Images captured and stored on drive  Diagnosis / Impression:  OD: No NFP, no IRF/SRF; +VMA OS: VMT with cystic changes/foveoschisis; +ERM   Clinical management:  See below  Abbreviations: NFP - Normal foveal profile. CME - cystoid macular edema. PED - pigment epithelial detachment. IRF - intraretinal fluid. SRF - subretinal fluid. EZ - ellipsoid zone. ERM - epiretinal membrane. ORA - outer retinal atrophy. ORT - outer retinal tubulation. SRHM - subretinal hyper-reflective material       Fluorescein Angiography Optos  (Transit OS)       Right Eye   Early phase findings include normal observations. Mid/Late phase findings include staining (Mild late staining temp peripherally, around disc, and para foveal).   Left Eye   Early phase findings include normal observations. Mid/Late phase findings include staining (Mild late staining temporal periphery, ?Hyper fluorescence of disc).   Notes **Images stored on drive**  Impression: OD: Mild late staining temp  peripherally, around disc, and para foveal OS: Mild late staining temporal periphery, ?Hyperfluorescence of disc; no angiographic CME                  ASSESSMENT/PLAN:    ICD-10-CM   1. Vitreomacular adhesion of both eyes  H43.823   2. Retinal edema  H35.81 OCT, Retina - OU - Both Eyes    Fluorescein Angiography Optos (Transit OS)  3. Epiretinal membrane (ERM) of left eye  H35.372   4. Combined forms of age-related cataract of both eyes  H25.813     1,2. Vitreomacular traction w/ foveoschisis OS; VMA OD - The natural history, anatomy, potential for loss of vision, and treatment options including vitrectomy techniques and the complications of endophthalmitis, retinal detachment, vitreous hemorrhage, cataract progression and permanent vision loss discussed with the patient. - discussed possibility of spontaneous release of VMT - no indication for surgery at this time -- recommend monitoring at this time - f/u in 6 weeks for DFE/OCT  3. Epiretinal membrane, OS - The natural history, anatomy, potential for loss of vision, and treatment options including vitrectomy techniques and the complications of endophthalmitis, retinal detachment, vitreous hemorrhage, cataract progression and permanent vision loss discussed with the patient. - moderate ERM - becoming visually significant - no metamorphopsia - no indication for surgery at this time - monitor for now - f/u 6 weeks for DFE/OCT  4. Mixed cataract OU  - The symptoms of cataract,  surgical options, and treatments and risks were discussed with patient. - discussed diagnosis and progression - not yet visually significant - monitor for now   Ophthalmic Meds Ordered this visit:  No orders of the defined types were placed in this encounter.      Return in about 6 weeks (around 07/21/2019) for VMT/ERM OS, Dilated exam, OCT.  There are no Patient Instructions on file for this visit.   Explained the diagnoses, plan, and follow up with the patient and they expressed understanding.  Patient expressed understanding of the importance of proper follow up care.   This document serves as a record of services personally performed by Gardiner Sleeper, MD, PhD. It was created on their behalf by Leeann Must, [place], a certified ophthalmic assistant. The creation of this record is the provider's dictation and/or activities during the visit.    Electronically signed by: Leeann Must, COA _0 @ 12:32 PM  Gardiner Sleeper, M.D., Ph.D. Diseases & Surgery of the Retina and Vitreous [facility]  I have reviewed the above documentation for accuracy and completeness, and I agree with the above. Gardiner Sleeper, M.D., Ph.D. 06/09/19 12:32 PM   Abbreviations: M myopia (nearsighted); A astigmatism; H hyperopia (farsighted); P presbyopia; Mrx spectacle prescription;  CTL contact lenses; OD right eye; OS left eye; OU both eyes  XT exotropia; ET esotropia; PEK punctate epithelial keratitis; PEE punctate epithelial erosions; DES dry eye syndrome; MGD meibomian gland dysfunction; ATs artificial tears; PFAT's preservative free artificial tears; [place] nuclear sclerotic cataract; PSC posterior subcapsular cataract; ERM epi-retinal membrane; PVD posterior vitreous detachment; RD retinal detachment; DM diabetes mellitus; DR diabetic retinopathy; NPDR non-proliferative diabetic retinopathy; PDR proliferative diabetic retinopathy; CSME clinically significant macular edema; DME  diabetic macular edema; dbh dot blot hemorrhages; CWS cotton wool spot; POAG primary open angle glaucoma; C/D cup-to-disc ratio; HVF humphrey visual field; GVF goldmann visual field; OCT optical coherence tomography; IOP intraocular pressure; BRVO Branch retinal vein occlusion; CRVO central retinal vein occlusion; CRAO central retinal artery occlusion;  BRAO branch retinal artery occlusion; RT retinal tear; SB scleral buckle; PPV pars plana vitrectomy; VH Vitreous hemorrhage; PRP panretinal laser photocoagulation; IVK intravitreal kenalog; VMT vitreomacular traction; MH Macular hole;  NVD neovascularization of the disc; NVE neovascularization elsewhere; AREDS age related eye disease study; ARMD age related macular degeneration; POAG primary open angle glaucoma; EBMD epithelial/anterior basement membrane dystrophy; ACIOL anterior chamber intraocular lens; IOL intraocular lens; PCIOL posterior chamber intraocular lens; Phaco/IOL phacoemulsification with intraocular lens placement; [place] photorefractive keratectomy; LASIK laser assisted in situ keratomileusis; HTN hypertension; DM diabetes mellitus; COPD chronic obstructive pulmonary disease

## 2019-06-09 ENCOUNTER — Ambulatory Visit (INDEPENDENT_AMBULATORY_CARE_PROVIDER_SITE_OTHER): Payer: PPO | Admitting: Ophthalmology

## 2019-06-09 ENCOUNTER — Encounter (INDEPENDENT_AMBULATORY_CARE_PROVIDER_SITE_OTHER): Payer: Self-pay | Admitting: Ophthalmology

## 2019-06-09 DIAGNOSIS — H25813 Combined forms of age-related cataract, bilateral: Secondary | ICD-10-CM

## 2019-06-09 DIAGNOSIS — H35372 Puckering of macula, left eye: Secondary | ICD-10-CM | POA: Diagnosis not present

## 2019-06-09 DIAGNOSIS — H43823 Vitreomacular adhesion, bilateral: Secondary | ICD-10-CM | POA: Diagnosis not present

## 2019-06-09 DIAGNOSIS — H3581 Retinal edema: Secondary | ICD-10-CM

## 2019-07-06 DIAGNOSIS — M545 Low back pain: Secondary | ICD-10-CM | POA: Diagnosis not present

## 2019-07-06 DIAGNOSIS — G8929 Other chronic pain: Secondary | ICD-10-CM | POA: Diagnosis not present

## 2019-07-06 DIAGNOSIS — Z6834 Body mass index (BMI) 34.0-34.9, adult: Secondary | ICD-10-CM | POA: Diagnosis not present

## 2019-07-09 DIAGNOSIS — M549 Dorsalgia, unspecified: Secondary | ICD-10-CM | POA: Diagnosis not present

## 2019-07-16 ENCOUNTER — Telehealth (INDEPENDENT_AMBULATORY_CARE_PROVIDER_SITE_OTHER): Payer: Self-pay

## 2019-07-19 NOTE — Progress Notes (Signed)
[facility] Note  07/21/2019     CHIEF COMPLAINT Patient presents for Retina Follow Up  HISTORY OF PRESENT ILLNESS: Alexis Tate is a 69 y.o. female who presents to the clinic today for:   HPI    Retina Follow Up    Patient presents with  Other.  In left eye.  This started 6 weeks ago.  Severity is moderate.  I, the attending physician,  performed the HPI with the patient and updated documentation appropriately.          Comments    Patient here for 6 weeks retina follow up for VMT/ERM OS. Patient states vision about the same. When moves eye sees something . When keeps focusing it drifts temporally to the side then fades. It's clear. Has a curly hair. Started about 2 weeks ago. Also hurt back 2 weeks ago put on meds methocarbamol and celecoxib. And using an ung triamcinolone. And had a kenalog injection.       Last edited by Bernarda Caffey, MD on 07/21/2019  9:16 AM. (History)    Pt states since she was here last, she sees a floater that looks like a hair in her vision, she states she also started seeing a black spot in her vision that moves when she moves her eye, she states if she keeps her eye focused straight ahead it will fade out temporally, she states this floater is changing and now is very faint yellow/grey, if she moves her eyes to the right it will come into her central vison   Referring physician: Valente David, [place],  [place] [address]  HISTORICAL INFORMATION:   Selected notes from the [facility] Referred by Dr. Hulan Saas for concern of mac hole OS   CURRENT MEDICATIONS: No current outpatient medications on file. (Ophthalmic Drugs)   No current facility-administered medications for this visit. (Ophthalmic Drugs)   Current Outpatient Medications (Other)  Medication Sig  . ALPRAZolam (XANAX) 0.5 MG tablet Take 0.5 mg by mouth as needed.  . mirabegron ER (MYRBETRIQ) 25 MG TB24 tablet Take 25 mg by mouth  daily.  Marland Kitchen oxyCODONE-acetaminophen (PERCOCET/ROXICET) 5-325 MG per tablet Take 2 tablets by mouth every 6 (six) hours as needed for severe pain. (Patient not taking: Reported on 06/09/2019)  . triamcinolone cream (KENALOG) 0.1 % Apply 1 application topically. Twice a week   No current facility-administered medications for this visit. (Other)      REVIEW OF SYSTEMS: ROS    Positive for: Eyes   Negative for: Constitutional, Gastrointestinal, Neurological, Skin, Genitourinary, Musculoskeletal, HENT, Endocrine, Cardiovascular, Respiratory, Psychiatric, Allergic/Imm, Heme/Lymph   Last edited by Theodore Demark, COA on 07/21/2019  8:53 AM. (History)       ALLERGIES Allergies  Allergen Reactions  . Amoxicillin   . Erythromycin   . Penicillins     PAST MEDICAL HISTORY Past Medical History:  Diagnosis Date  . Asthma    Past Surgical History:  Procedure Laterality Date  . FOOT SURGERY    . WRIST SURGERY      FAMILY HISTORY Family History  Problem Relation Age of Onset  . Diabetes Mother   . Diabetes Maternal Aunt     SOCIAL HISTORY Social History   Tobacco Use  . Smoking status: Never Smoker  . Smokeless tobacco: Never Used  Substance Use Topics  . Alcohol use: No  . Drug use: No         OPHTHALMIC EXAM:  Base Eye Exam    Visual Acuity (Snellen - Linear)      Right Left   Dist cc 20/25 +1 20/30 -1   Dist ph cc 20/20 NI   Correction: Glasses       Tonometry (Tonopen, 8:47 AM)      Right Left   Pressure 13 12       Pupils      Dark Light Shape React APD   Right 3 2 Round Brisk None   Left 3 2 Round Brisk None       Visual Fields (Counting fingers)      Left Right    Full Full       Extraocular Movement      Right Left    Full, Ortho Full, Ortho       Neuro/Psych    Oriented x3: Yes   Mood/Affect: Normal       Dilation    Both eyes: 1.0% Mydriacyl, 2.5% Phenylephrine @ 8:47 AM        Slit Lamp and Fundus Exam    Slit Lamp Exam       Right Left   Lids/Lashes Dermatochalasis - upper lid, mild Meibomian gland dysfunction Dermatochalasis - upper lid, mild Meibomian gland dysfunction   Conjunctiva/Sclera White and quiet White and quiet   Cornea trace arcus, trace PEE, mild tear film debris trace arcus, trace PEE, mild tear film debris   Anterior Chamber Deep and quiet Deep and quiet, no cell or flare   Iris Round and dilated, focal pigmented spot at 0900 Round and dilated   Lens 2+Nuclear sclerosis, 2-3+ Cortical 2+Nuclear sclerosis, 2-3+ Cortical   Vitreous Vitreous syneresis Vitreous syneresis, Posterior vitreous detachment, Weiss ring, vitreous condensations       Fundus Exam      Right Left   Disc Pink and sharp Pink and sharp, compact   C/D Ratio 0.2 0.2   Macula Flat, Blunted foveal reflex, Retinal pigment epithelial mottling, No heme or edema Flat, Blunted foveal reflex, mild ERM, +Central cystic changes, no heme   Vessels Vascular attenuation, Tortuous Vascular attenuation, Tortuous   Periphery Attached, mild reticular degeneration, no heme Attached, mild reticular degeneration, no heme, No RT/RD 360         Refraction    Wearing Rx      Sphere Cylinder Axis Add   Right +0.50 +0.75 180 +2.50   Left [place] +0.75 180 +2.50          IMAGING AND PROCEDURES  Imaging and Procedures for _0 @  OCT, Retina - OU - Both Eyes       Right Eye Quality was good. Central Foveal Thickness: 295. Progression has been stable. Findings include normal foveal contour, no IRF, no SRF, vitreomacular adhesion .   Left Eye Quality was good. Central Foveal Thickness: 423. Progression has improved. Findings include abnormal foveal contour, vitreous traction, epiretinal membrane, no SRF, intraretinal fluid (Interval release of VMT; interval improvement in foveal profile - still abnormal due to ERM with +foveoschisis).   Notes *Images captured and stored on drive  Diagnosis / Impression:  OD: No NFP, no IRF/SRF; +VMA --  stable OS: Interval release of VMT; interval improvement in foveal profile - still abnormal due to ERM with +foveoschisis   Clinical management:  See below  Abbreviations: NFP - Normal foveal profile. CME - cystoid macular edema. PED - pigment epithelial detachment. IRF - intraretinal fluid. SRF - subretinal fluid. EZ - ellipsoid zone. ERM - epiretinal  membrane. ORA - outer retinal atrophy. ORT - outer retinal tubulation. SRHM - subretinal hyper-reflective material                ASSESSMENT/PLAN:    ICD-10-CM   1. Vitreomacular adhesion of right eye  H43.821   2. Posterior vitreous detachment of left eye  H43.812   3. Retinal edema  H35.81 OCT, Retina - OU - Both Eyes  4. Epiretinal membrane (ERM) of left eye  H35.372   5. 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 VMA OD; Vitreomacular traction w/ foveoschisis OS  - stable VMA OD  - today, OS with interval release of VMT -- now subacuate PVD OS w/ symptomatic floaters  - Discussed findings and prognosis  - No RT or RD on 360 peripheral exam  - Reviewed s/s of RT/RD  - Strict return precautions for any such RT/RD signs/symptoms  - f/u in 4-6 weeks for DFE/OCT, recheck of PVD  3. Epiretinal membrane, OS  - moderate ERM w/ foveoschisis (mildly improved with release of VMT as above)  - no metamorphopsia  - BCVA 20/30 from 20/40  - no indication for surgery at this time  - monitor for now  4. Mixed cataract OU   - The symptoms of cataract, surgical options, and treatments and risks were discussed with patient.  - discussed diagnosis and progression  - not yet visually significant  - monitor for now   Ophthalmic Meds Ordered this visit:  No orders of the defined types were placed in this encounter.      Return for f/u 4-6 weeks, PVD OS/VMA OD, DFE, OCT.  There are no Patient Instructions on file for this visit.   Explained the diagnoses, plan, and follow up with the patient and they  expressed understanding.  Patient expressed understanding of the importance of proper follow up care.   This document serves as a record of services personally performed by Gardiner Sleeper, MD, PhD. It was created on their behalf by Ernest Mallick, OA, an ophthalmic assistant. The creation of this record is the provider's dictation and/or activities during the visit.    Electronically signed by: Ernest Mallick, OA 05.12.2021 12:42 PM  Gardiner Sleeper, M.D., Ph.D. Diseases & Surgery of the Retina and [place] 07/21/2019   I have reviewed the above documentation for accuracy and completeness, and I agree with the above. Gardiner Sleeper, M.D., Ph.D. 07/21/19 12:42 PM    Abbreviations: M myopia (nearsighted); A astigmatism; H hyperopia (farsighted); P presbyopia; Mrx spectacle prescription;  CTL contact lenses; OD right eye; OS left eye; OU both eyes  XT exotropia; ET esotropia; PEK punctate epithelial keratitis; PEE punctate epithelial erosions; DES dry eye syndrome; MGD meibomian gland dysfunction; ATs artificial tears; PFAT's preservative free artificial tears; [place] nuclear sclerotic cataract; PSC posterior subcapsular cataract; ERM epi-retinal membrane; PVD posterior vitreous detachment; RD retinal detachment; DM diabetes mellitus; DR diabetic retinopathy; NPDR non-proliferative diabetic retinopathy; PDR proliferative diabetic retinopathy; CSME clinically significant macular edema; DME diabetic macular edema; dbh dot blot hemorrhages; CWS cotton wool spot; POAG primary open angle glaucoma; C/D cup-to-disc ratio; HVF humphrey visual field; GVF goldmann visual field; OCT optical coherence tomography; IOP intraocular pressure; BRVO Branch retinal vein occlusion; CRVO central retinal vein occlusion; CRAO central retinal artery occlusion; BRAO branch retinal artery occlusion; RT retinal tear; SB scleral buckle; PPV pars plana vitrectomy; VH Vitreous hemorrhage; PRP panretinal  laser photocoagulation;  IVK intravitreal kenalog; VMT vitreomacular traction; MH Macular hole;  NVD neovascularization of the disc; NVE neovascularization elsewhere; AREDS age related eye disease study; ARMD age related macular degeneration; POAG primary open angle glaucoma; EBMD epithelial/anterior basement membrane dystrophy; ACIOL anterior chamber intraocular lens; IOL intraocular lens; PCIOL posterior chamber intraocular lens; Phaco/IOL phacoemulsification with intraocular lens placement; [place] photorefractive keratectomy; LASIK laser assisted in situ keratomileusis; HTN hypertension; DM diabetes mellitus; COPD chronic obstructive pulmonary disease

## 2019-07-21 ENCOUNTER — Other Ambulatory Visit: Payer: Self-pay

## 2019-07-21 ENCOUNTER — Encounter (INDEPENDENT_AMBULATORY_CARE_PROVIDER_SITE_OTHER): Payer: Self-pay | Admitting: Ophthalmology

## 2019-07-21 ENCOUNTER — Ambulatory Visit (INDEPENDENT_AMBULATORY_CARE_PROVIDER_SITE_OTHER): Payer: PPO | Admitting: Ophthalmology

## 2019-07-21 DIAGNOSIS — H43821 Vitreomacular adhesion, right eye: Secondary | ICD-10-CM

## 2019-07-21 DIAGNOSIS — H25813 Combined forms of age-related cataract, bilateral: Secondary | ICD-10-CM

## 2019-07-21 DIAGNOSIS — H35372 Puckering of macula, left eye: Secondary | ICD-10-CM | POA: Diagnosis not present

## 2019-07-21 DIAGNOSIS — H43823 Vitreomacular adhesion, bilateral: Secondary | ICD-10-CM

## 2019-07-21 DIAGNOSIS — H43812 Vitreous degeneration, left eye: Secondary | ICD-10-CM | POA: Diagnosis not present

## 2019-07-21 DIAGNOSIS — H3581 Retinal edema: Secondary | ICD-10-CM

## 2019-08-18 DIAGNOSIS — G4762 Sleep related leg cramps: Secondary | ICD-10-CM | POA: Diagnosis not present

## 2019-08-18 DIAGNOSIS — Z5181 Encounter for therapeutic drug level monitoring: Secondary | ICD-10-CM | POA: Diagnosis not present

## 2019-08-18 DIAGNOSIS — Z131 Encounter for screening for diabetes mellitus: Secondary | ICD-10-CM | POA: Diagnosis not present

## 2019-08-18 DIAGNOSIS — Z1211 Encounter for screening for malignant neoplasm of colon: Secondary | ICD-10-CM | POA: Diagnosis not present

## 2019-08-18 DIAGNOSIS — Z1339 Encounter for screening examination for other mental health and behavioral disorders: Secondary | ICD-10-CM | POA: Diagnosis not present

## 2019-08-18 DIAGNOSIS — N904 Leukoplakia of vulva: Secondary | ICD-10-CM | POA: Diagnosis not present

## 2019-08-18 DIAGNOSIS — G8929 Other chronic pain: Secondary | ICD-10-CM | POA: Diagnosis not present

## 2019-08-18 DIAGNOSIS — Z79899 Other long term (current) drug therapy: Secondary | ICD-10-CM | POA: Diagnosis not present

## 2019-08-18 DIAGNOSIS — Z0000 Encounter for general adult medical examination without abnormal findings: Secondary | ICD-10-CM | POA: Diagnosis not present

## 2019-08-18 DIAGNOSIS — Z791 Long term (current) use of non-steroidal anti-inflammatories (NSAID): Secondary | ICD-10-CM | POA: Diagnosis not present

## 2019-08-18 DIAGNOSIS — Z1331 Encounter for screening for depression: Secondary | ICD-10-CM | POA: Diagnosis not present

## 2019-08-26 NOTE — Progress Notes (Signed)
[facility] Note  08/30/2019     CHIEF COMPLAINT Patient presents for Retina Follow Up  HISTORY OF PRESENT ILLNESS: Alexis Tate is a 69 y.o. female who presents to the clinic today for:   HPI    Retina Follow Up    Diagnosis: VMA.  In both eyes.  Severity is moderate.  Duration of 5.5 weeks.  Since onset it is stable.  I, the attending physician,  performed the HPI with the patient and updated documentation appropriately.          Comments    Patient states vision the same OU. No new floaters/FOL.       Last edited by Bernarda Caffey, MD on 08/31/2019 10:42 PM. (History)    Pt states "everything is about the same", she has recently started using Refresh for dryness  Referring physician: Mateo Flow, MD [place],  [place] [address]  HISTORICAL INFORMATION:   Selected notes from the MEDICAL RECORD NUM[facility] Referred by Dr. Hulan Saas for concern of mac hole OS   CURRENT MEDICATIONS: No current outpatient medications on file. (Ophthalmic Drugs)   No current facility-administered medications for this visit. (Ophthalmic Drugs)   Current Outpatient Medications (Other)  Medication Sig  . ALPRAZolam (XANAX) 0.5 MG tablet Take 0.5 mg by mouth as needed.  . mirabegron ER (MYRBETRIQ) 25 MG TB24 tablet Take 25 mg by mouth daily.  Marland Kitchen triamcinolone cream (KENALOG) 0.1 % Apply 1 application topically. Twice a week  . oxyCODONE-acetaminophen (PERCOCET/ROXICET) 5-325 MG per tablet Take 2 tablets by mouth every 6 (six) hours as needed for severe pain. (Patient not taking: Reported on 06/09/2019)   No current facility-administered medications for this visit. (Other)      REVIEW OF SYSTEMS: ROS    Positive for: Eyes   Negative for: Constitutional, Gastrointestinal, Neurological, Skin, Genitourinary, Musculoskeletal, HENT, Endocrine, Cardiovascular, Respiratory, Psychiatric, Allergic/Imm, Heme/Lymph   Last edited by Roselee Nova D, COT on  08/30/2019  8:04 AM. (History)       ALLERGIES Allergies  Allergen Reactions  . Amoxicillin   . Erythromycin   . Penicillins     PAST MEDICAL HISTORY Past Medical History:  Diagnosis Date  . Asthma    Past Surgical History:  Procedure Laterality Date  . FOOT SURGERY    . WRIST SURGERY      FAMILY HISTORY Family History  Problem Relation Age of Onset  . Diabetes Mother   . Diabetes Maternal Aunt     SOCIAL HISTORY Social History   Tobacco Use  . Smoking status: Never Smoker  . Smokeless tobacco: Never Used  Substance Use Topics  . Alcohol use: No  . Drug use: No         OPHTHALMIC EXAM:  Base Eye Exam    Visual Acuity (Snellen - Linear)      Right Left   Dist [place] 20/30 20/40 +1   Dist ph [place] 20/20 -2 20/25 -2       Tonometry (Tonopen, 8:11 AM)      Right Left   Pressure 14 13       Pupils      Dark Light Shape React APD   Right 3 2 Round Brisk None   Left 3 2 Round Brisk None       Visual Fields (Counting fingers)      Left Right    Full Full       Extraocular Movement  Right Left    Full, Ortho Full, Ortho       Neuro/Psych    Oriented x3: Yes   Mood/Affect: Normal       Dilation    Both eyes: 1.0% Mydriacyl, 2.5% Phenylephrine @ 8:11 AM        Slit Lamp and Fundus Exam    Slit Lamp Exam      Right Left   Lids/Lashes Dermatochalasis - upper lid, mild Meibomian gland dysfunction Dermatochalasis - upper lid, mild Meibomian gland dysfunction   Conjunctiva/Sclera White and quiet White and quiet   Cornea trace arcus, 1+PEE, mild tear film debris trace arcus, trace PEE, mild tear film debris   Anterior Chamber Deep and quiet Deep and quiet, no cell or flare   Iris Round and dilated, focal pigmented spot at 0900 Round and dilated   Lens 2+Nuclear sclerosis, 2-3+ Cortical 2+Nuclear sclerosis, 2-3+ Cortical   Vitreous Vitreous syneresis Vitreous syneresis, Posterior vitreous detachment, Weiss ring, vitreous condensations        Fundus Exam      Right Left   Disc Pink and sharp Pink and sharp, compact   C/D Ratio 0.2 0.2   Macula Flat, Blunted foveal reflex, Retinal pigment epithelial mottling, No heme or edema Flat, Blunted foveal reflex, mild ERM, +Central cystic changes, no heme, RPE mottling and clumping   Vessels Vascular attenuation, Tortuous Vascular attenuation, Tortuous   Periphery Attached, mild reticular degeneration, no heme Attached, mild reticular degeneration, no heme, No RT/RD 360           IMAGING AND PROCEDURES  Imaging and Procedures for _0 @  OCT, Retina - OU - Both Eyes       Right Eye Quality was good. Central Foveal Thickness: 295. Progression has been stable. Findings include normal foveal contour, no IRF, no SRF, vitreomacular adhesion .   Left Eye Quality was good. Central Foveal Thickness: 402. Progression has been stable. Findings include abnormal foveal contour, vitreous traction, epiretinal membrane, no SRF, intraretinal fluid (Persistent ERM with fovioschisis/cystic changes slightly improved).   Notes *Images captured and stored on drive  Diagnosis / Impression:  OD: No NFP, no IRF/SRF; +VMA -- stable OS: Persistent ERM with foveoschisis/cystic changes slightly improved   Clinical management:  See below  Abbreviations: NFP - Normal foveal profile. CME - cystoid macular edema. PED - pigment epithelial detachment. IRF - intraretinal fluid. SRF - subretinal fluid. EZ - ellipsoid zone. ERM - epiretinal membrane. ORA - outer retinal atrophy. ORT - outer retinal tubulation. SRHM - subretinal hyper-reflective material                ASSESSMENT/PLAN:    ICD-10-CM   1. Vitreomacular adhesion of right eye  H43.821   2. Posterior vitreous detachment of left eye  H43.812   3. Retinal edema  H35.81 OCT, Retina - OU - Both Eyes  4. Epiretinal membrane (ERM) of left eye  H35.372   5. 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 VMA OD; PVD  OS  - stable VMA OD  - stable PVD OS w/ improvement in symptomatic floaters  - Discussed findings and prognosis  - No RT or RD on 360 peripheral exam  - Reviewed s/s of RT/RD  - Strict return precautions for any such RT/RD signs/symptoms  - f/u in 3-4 months for DFE/OCT  4. Epiretinal membrane, OS  - moderate ERM w/ foveoschisis (mildly improved with release of VMT as above)  -  no metamorphopsia  - BCVA 20/30 from 20/40  - no indication for surgery at this time  - monitor for now  5. Mixed cataract OU   - The symptoms of cataract, surgical options, and treatments and risks were discussed with patient.  - discussed diagnosis and progression  - not yet visually significant  - monitor for now   Ophthalmic Meds Ordered this visit:  No orders of the defined types were placed in this encounter.      Return for f/u 3-4 months, VMT OS, DFE, OCT.  There are no Patient Instructions on file for this visit.   Explained the diagnoses, plan, and follow up with the patient and they expressed understanding.  Patient expressed understanding of the importance of proper follow up care.   This document serves as a record of services personally performed by Gardiner Sleeper, MD, PhD. It was created on their behalf by Leeann Must, [place], a certified ophthalmic assistant. The creation of this record is the provider's dictation and/or activities during the visit.    Electronically signed by: Leeann Must, COA _0 @ 10:45 PM   This document serves as a record of services personally performed by Gardiner Sleeper, MD, PhD. It was created on their behalf by Ernest Mallick, OA, an ophthalmic assistant. The creation of this record is the provider's dictation and/or activities during the visit.    Electronically signed by: Ernest Mallick, OA 06.21.2021 10:45 PM   Gardiner Sleeper, M.D., Ph.D. Diseases & Surgery of the Retina and Vitreous [facility]  I have reviewed the above  documentation for accuracy and completeness, and I agree with the above. Gardiner Sleeper, M.D., Ph.D. 08/31/19 10:45 PM   Abbreviations: M myopia (nearsighted); A astigmatism; H hyperopia (farsighted); P presbyopia; Mrx spectacle prescription;  CTL contact lenses; OD right eye; OS left eye; OU both eyes  XT exotropia; ET esotropia; PEK punctate epithelial keratitis; PEE punctate epithelial erosions; DES dry eye syndrome; MGD meibomian gland dysfunction; ATs artificial tears; PFAT's preservative free artificial tears; [place] nuclear sclerotic cataract; PSC posterior subcapsular cataract; ERM epi-retinal membrane; PVD posterior vitreous detachment; RD retinal detachment; DM diabetes mellitus; DR diabetic retinopathy; NPDR non-proliferative diabetic retinopathy; PDR proliferative diabetic retinopathy; CSME clinically significant macular edema; DME diabetic macular edema; dbh dot blot hemorrhages; CWS cotton wool spot; POAG primary open angle glaucoma; C/D cup-to-disc ratio; HVF humphrey visual field; GVF goldmann visual field; OCT optical coherence tomography; IOP intraocular pressure; BRVO Branch retinal vein occlusion; CRVO central retinal vein occlusion; CRAO central retinal artery occlusion; BRAO branch retinal artery occlusion; RT retinal tear; SB scleral buckle; PPV pars plana vitrectomy; VH Vitreous hemorrhage; PRP panretinal laser photocoagulation; IVK intravitreal kenalog; VMT vitreomacular traction; MH Macular hole;  NVD neovascularization of the disc; NVE neovascularization elsewhere; AREDS age related eye disease study; ARMD age related macular degeneration; POAG primary open angle glaucoma; EBMD epithelial/anterior basement membrane dystrophy; ACIOL anterior chamber intraocular lens; IOL intraocular lens; PCIOL posterior chamber intraocular lens; Phaco/IOL phacoemulsification with intraocular lens placement; [place] photorefractive keratectomy; LASIK laser assisted in situ keratomileusis; HTN hypertension; DM  diabetes mellitus; COPD chronic obstructive pulmonary disease

## 2019-08-30 ENCOUNTER — Encounter (INDEPENDENT_AMBULATORY_CARE_PROVIDER_SITE_OTHER): Payer: Self-pay | Admitting: Ophthalmology

## 2019-08-30 ENCOUNTER — Other Ambulatory Visit: Payer: Self-pay

## 2019-08-30 ENCOUNTER — Ambulatory Visit (INDEPENDENT_AMBULATORY_CARE_PROVIDER_SITE_OTHER): Payer: PPO | Admitting: Ophthalmology

## 2019-08-30 DIAGNOSIS — H35372 Puckering of macula, left eye: Secondary | ICD-10-CM

## 2019-08-30 DIAGNOSIS — H25813 Combined forms of age-related cataract, bilateral: Secondary | ICD-10-CM

## 2019-08-30 DIAGNOSIS — H3581 Retinal edema: Secondary | ICD-10-CM | POA: Diagnosis not present

## 2019-08-30 DIAGNOSIS — H43812 Vitreous degeneration, left eye: Secondary | ICD-10-CM | POA: Diagnosis not present

## 2019-08-30 DIAGNOSIS — H43821 Vitreomacular adhesion, right eye: Secondary | ICD-10-CM

## 2019-08-31 ENCOUNTER — Encounter (INDEPENDENT_AMBULATORY_CARE_PROVIDER_SITE_OTHER): Payer: Self-pay | Admitting: Ophthalmology

## 2019-10-12 DIAGNOSIS — L820 Inflamed seborrheic keratosis: Secondary | ICD-10-CM | POA: Diagnosis not present

## 2019-10-12 DIAGNOSIS — D2239 Melanocytic nevi of other parts of face: Secondary | ICD-10-CM | POA: Diagnosis not present

## 2019-10-12 DIAGNOSIS — D225 Melanocytic nevi of trunk: Secondary | ICD-10-CM | POA: Diagnosis not present

## 2019-10-12 DIAGNOSIS — M1711 Unilateral primary osteoarthritis, right knee: Secondary | ICD-10-CM | POA: Diagnosis not present

## 2019-10-12 DIAGNOSIS — L918 Other hypertrophic disorders of the skin: Secondary | ICD-10-CM | POA: Diagnosis not present

## 2019-10-12 DIAGNOSIS — L821 Other seborrheic keratosis: Secondary | ICD-10-CM | POA: Diagnosis not present

## 2019-10-29 DIAGNOSIS — J40 Bronchitis, not specified as acute or chronic: Secondary | ICD-10-CM | POA: Diagnosis not present

## 2019-10-29 DIAGNOSIS — Z20828 Contact with and (suspected) exposure to other viral communicable diseases: Secondary | ICD-10-CM | POA: Diagnosis not present

## 2019-10-29 DIAGNOSIS — J329 Chronic sinusitis, unspecified: Secondary | ICD-10-CM | POA: Diagnosis not present

## 2019-11-26 NOTE — Progress Notes (Signed)
[facility] Note  12/01/2019     CHIEF COMPLAINT Patient presents for Retina Follow Up  HISTORY OF PRESENT ILLNESS: Alexis Tate is a 69 y.o. female who presents to the clinic today for:   HPI    Retina Follow Up    Patient presents with  PVD.  In left eye.  Duration of 3 months.  Since onset it is stable.  I, the attending physician,  performed the HPI with the patient and updated documentation appropriately.          Comments    3 month follow up VMA OD, PVD OS- Vision stable OU.  Still has the "spot" OS that is constant.  At times she thinks it is worse but hard to tell.  Denies FOL's. Using Refresh prn.       Last edited by Bernarda Caffey, MD on 12/01/2019  8:48 AM. (History)    Pt states no major change in vision since last exam, she states she is seeing halo's around objects (like when she looks at the moon at night), she is not seeing any distortion in lines  Referring physician: Valente David, [place],   [id number]  HISTORICAL INFORMATION:   Selected notes from the [facility] Referred by Dr. Hulan Saas for concern of mac hole OS   CURRENT MEDICATIONS: No current outpatient medications on file. (Ophthalmic Drugs)   No current facility-administered medications for this visit. (Ophthalmic Drugs)   Current Outpatient Medications (Other)  Medication Sig  . ALPRAZolam (XANAX) 0.5 MG tablet Take 0.5 mg by mouth as needed.  . mirabegron ER (MYRBETRIQ) 25 MG TB24 tablet Take 25 mg by mouth daily.  Marland Kitchen triamcinolone cream (KENALOG) 0.1 % Apply 1 application topically. Twice a week  . oxyCODONE-acetaminophen (PERCOCET/ROXICET) 5-325 MG per tablet Take 2 tablets by mouth every 6 (six) hours as needed for severe pain. (Patient not taking: Reported on 06/09/2019)   No current facility-administered medications for this visit. (Other)      REVIEW OF SYSTEMS: ROS    Positive for: Eyes   Negative for: Constitutional,  Gastrointestinal, Neurological, Skin, Genitourinary, Musculoskeletal, HENT, Endocrine, Cardiovascular, Respiratory, Psychiatric, Allergic/Imm, Heme/Lymph   Last edited by Leonie Douglas, COA on 12/01/2019  8:09 AM. (History)       ALLERGIES Allergies  Allergen Reactions  . Amoxicillin   . Erythromycin   . Penicillins     PAST MEDICAL HISTORY Past Medical History:  Diagnosis Date  . Asthma    Past Surgical History:  Procedure Laterality Date  . FOOT SURGERY    . WRIST SURGERY      FAMILY HISTORY Family History  Problem Relation Age of Onset  . Diabetes Mother   . Diabetes Maternal Aunt     SOCIAL HISTORY Social History   Tobacco Use  . Smoking status: Never Smoker  . Smokeless tobacco: Never Used  Substance Use Topics  . Alcohol use: No  . Drug use: No         OPHTHALMIC EXAM:  Base Eye Exam    Visual Acuity (Snellen - Linear)      Right Left   Dist cc 20/25 +2 20/30   Dist ph cc  NI  When checking vision OS the screen looks fuzzy all the way around it with halos.  Same if looking at the moon at night with OS, big halo around it.         Tonometry (Tonopen, 8:22  AM)      Right Left   Pressure 16 13       Pupils      Dark Light Shape React APD   Right 3 2 Round Brisk None   Left 3 2 Round Brisk None       Visual Fields (Counting fingers)      Left Right    Full Full       Extraocular Movement      Right Left    Full Full       Neuro/Psych    Oriented x3: Yes   Mood/Affect: Normal       Dilation    Both eyes: 1.0% Mydriacyl, 2.5% Phenylephrine @ 8:22 AM        Slit Lamp and Fundus Exam    Slit Lamp Exam      Right Left   Lids/Lashes Dermatochalasis - upper lid, mild Meibomian gland dysfunction Dermatochalasis - upper lid, mild Meibomian gland dysfunction   Conjunctiva/Sclera White and quiet White and quiet   Cornea trace arcus, 1+PEE, mild tear film debris trace arcus, trace PEE, mild tear film debris   Anterior Chamber Deep and  quiet Deep and quiet, no cell or flare   Iris Round and dilated, focal pigmented spot at 0900 Round and dilated   Lens 2+Nuclear sclerosis, 2-3+ Cortical 2-3+Nuclear sclerosis, 2-3+ Cortical   Vitreous Vitreous syneresis Vitreous syneresis, Posterior vitreous detachment, Weiss ring, vitreous condensations       Fundus Exam      Right Left   Disc Pink and sharp Pink and sharp, compact   C/D Ratio 0.2 0.2   Macula Flat, Blunted foveal reflex, Retinal pigment epithelial mottling, No heme or edema Flat, Blunted foveal reflex, ERM, +Central cystic changes, no heme, RPE mottling and clumping   Vessels Vascular attenuation, Tortuous Vascular attenuation, Tortuous   Periphery Attached, mild reticular degeneration, no heme Attached, mild reticular degeneration, no heme, No RT/RD 360           IMAGING AND PROCEDURES  Imaging and Procedures for _0 @  OCT, Retina - OU - Both Eyes       Right Eye Quality was good. Central Foveal Thickness: 293. Progression has been stable. Findings include normal foveal contour, no IRF, no SRF, vitreomacular adhesion .   Left Eye Quality was good. Central Foveal Thickness: 412. Progression has been stable. Findings include abnormal foveal contour, epiretinal membrane, no SRF, intraretinal fluid, macular pucker (Persistent central cystic changes, interval blunting of central foveal depression).   Notes *Images captured and stored on drive  Diagnosis / Impression:  OD: No NFP, no IRF/SRF; +VMA -- stable OS: ERM w/ persistent central cystic changes, interval blunting of central foveal depression   Clinical management:  See below  Abbreviations: NFP - Normal foveal profile. CME - cystoid macular edema. PED - pigment epithelial detachment. IRF - intraretinal fluid. SRF - subretinal fluid. EZ - ellipsoid zone. ERM - epiretinal membrane. ORA - outer retinal atrophy. ORT - outer retinal tubulation. SRHM - subretinal hyper-reflective material                 ASSESSMENT/PLAN:    ICD-10-CM   1. Vitreomacular adhesion of right eye  H43.821   2. Posterior vitreous detachment of left eye  H43.812   3. Retinal edema  H35.81 OCT, Retina - OU - Both Eyes  4. Epiretinal membrane (ERM) of left eye  H35.372   5. 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 VMA OD; PVD OS  - stable VMA OD  - stable PVD OS w/ improvement in symptomatic floaters  - Discussed findings and prognosis  - No RT or RD on 360 peripheral exam  - Reviewed s/s of RT/RD  - Strict return precautions for any such RT/RD signs/symptoms  - f/u in 3 months for DFE/OCT  4. Epiretinal membrane, OS  - moderate ERM w/ foveoschisis  - no metamorphopsia  - OCT shows mild blunting of foveal contour  - BCVA stable at 20/30   - no indication for surgery at this time  - monitor for now  5. Mixed cataract OU   - The symptoms of cataract, surgical options, and treatments and risks were discussed with patient.  - discussed diagnosis and progression  - not yet visually significant  - monitor for now   Ophthalmic Meds Ordered this visit:  No orders of the defined types were placed in this encounter.      Return in about 3 months (around 03/01/2020) for f/u VMA OD/PVD OS, DFE, OCT.  There are no Patient Instructions on file for this visit.   Explained the diagnoses, plan, and follow up with the patient and they expressed understanding.  Patient expressed understanding of the importance of proper follow up care.    This document serves as a record of services personally performed by Gardiner Sleeper, MD, PhD. It was created on their behalf by Roselee Nova, COMT. The creation of this record is the provider's dictation and/or activities during the visit.  Electronically signed by: Roselee Nova, COMT 12/01/19 1:04 PM   This document serves as a record of services personally performed by Gardiner Sleeper, MD, PhD. It was created on their behalf by San Jetty.  Owens Shark, OA an ophthalmic technician. The creation of this record is the provider's dictation and/or activities during the visit.    Electronically signed by: San Jetty. Owens Shark, [place] 09.22.2021 1:04 PM  Gardiner Sleeper, M.D., Ph.D. Diseases & Surgery of the Retina and Vitreous [facility]  I have reviewed the above documentation for accuracy and completeness, and I agree with the above. Gardiner Sleeper, M.D., Ph.D. 12/01/19 1:04 PM  Abbreviations: M myopia (nearsighted); A astigmatism; H hyperopia (farsighted); P presbyopia; Mrx spectacle prescription;  CTL contact lenses; OD right eye; OS left eye; OU both eyes  XT exotropia; ET esotropia; PEK punctate epithelial keratitis; PEE punctate epithelial erosions; DES dry eye syndrome; MGD meibomian gland dysfunction; ATs artificial tears; PFAT's preservative free artificial tears; [place] nuclear sclerotic cataract; PSC posterior subcapsular cataract; ERM epi-retinal membrane; PVD posterior vitreous detachment; RD retinal detachment; DM diabetes mellitus; DR diabetic retinopathy; NPDR non-proliferative diabetic retinopathy; PDR proliferative diabetic retinopathy; CSME clinically significant macular edema; DME diabetic macular edema; dbh dot blot hemorrhages; CWS cotton wool spot; POAG primary open angle glaucoma; C/D cup-to-disc ratio; HVF humphrey visual field; GVF goldmann visual field; OCT optical coherence tomography; IOP intraocular pressure; BRVO Branch retinal vein occlusion; CRVO central retinal vein occlusion; CRAO central retinal artery occlusion; BRAO branch retinal artery occlusion; RT retinal tear; SB scleral buckle; PPV pars plana vitrectomy; VH Vitreous hemorrhage; PRP panretinal laser photocoagulation; IVK intravitreal kenalog; VMT vitreomacular traction; MH Macular hole;  NVD neovascularization of the disc; NVE neovascularization elsewhere; AREDS age related eye disease study; ARMD age related macular degeneration; POAG primary  open angle glaucoma; EBMD epithelial/anterior basement membrane dystrophy; ACIOL anterior chamber intraocular lens; IOL intraocular lens; PCIOL posterior  chamber intraocular lens; Phaco/IOL phacoemulsification with intraocular lens placement; [place] photorefractive keratectomy; LASIK laser assisted in situ keratomileusis; HTN hypertension; DM diabetes mellitus; COPD chronic obstructive pulmonary disease

## 2019-12-01 ENCOUNTER — Ambulatory Visit (INDEPENDENT_AMBULATORY_CARE_PROVIDER_SITE_OTHER): Payer: PPO | Admitting: Ophthalmology

## 2019-12-01 ENCOUNTER — Other Ambulatory Visit: Payer: Self-pay

## 2019-12-01 ENCOUNTER — Encounter (INDEPENDENT_AMBULATORY_CARE_PROVIDER_SITE_OTHER): Payer: Self-pay | Admitting: Ophthalmology

## 2019-12-01 DIAGNOSIS — H43812 Vitreous degeneration, left eye: Secondary | ICD-10-CM

## 2019-12-01 DIAGNOSIS — H25813 Combined forms of age-related cataract, bilateral: Secondary | ICD-10-CM

## 2019-12-01 DIAGNOSIS — H3581 Retinal edema: Secondary | ICD-10-CM

## 2019-12-01 DIAGNOSIS — H43821 Vitreomacular adhesion, right eye: Secondary | ICD-10-CM | POA: Diagnosis not present

## 2019-12-01 DIAGNOSIS — H35372 Puckering of macula, left eye: Secondary | ICD-10-CM

## 2020-03-15 ENCOUNTER — Other Ambulatory Visit: Payer: Self-pay | Admitting: Obstetrics

## 2020-03-15 DIAGNOSIS — R928 Other abnormal and inconclusive findings on diagnostic imaging of breast: Secondary | ICD-10-CM

## 2020-04-02 DIAGNOSIS — R0602 Shortness of breath: Secondary | ICD-10-CM | POA: Diagnosis not present

## 2020-04-02 DIAGNOSIS — U071 COVID-19: Secondary | ICD-10-CM | POA: Diagnosis not present

## 2020-04-03 ENCOUNTER — Encounter (INDEPENDENT_AMBULATORY_CARE_PROVIDER_SITE_OTHER): Payer: PPO | Admitting: Ophthalmology

## 2020-04-04 DIAGNOSIS — J988 Other specified respiratory disorders: Secondary | ICD-10-CM | POA: Diagnosis not present

## 2020-04-04 DIAGNOSIS — U071 COVID-19: Secondary | ICD-10-CM | POA: Diagnosis not present

## 2020-04-13 NOTE — Progress Notes (Signed)
[facility] Note  04/18/2020     CHIEF COMPLAINT Patient presents for Retina Follow Up  HISTORY OF PRESENT ILLNESS: Alexis Tate is a 70 y.o. female who presents to the clinic today for:   HPI    Retina Follow Up    Patient presents with  Other.  In both eyes.  This started 3 months ago.  I, the attending physician,  performed the HPI with the patient and updated documentation appropriately.          Comments    Patient here for 3 months retina follow up for VMA OD/PVD OS. Patient states vision doing good. No eye pain.        Last edited by Bernarda Caffey, MD on 04/18/2020 11:39 PM. (History)    pt states no change in vision, she denies fol, but states she still has the same floater she had last time  Referring physician: Valente David, [place],  [place] [id number]  HISTORICAL INFORMATION:   Selected notes from the [facility] Referred by Dr. Hulan Saas for concern of mac hole OS   CURRENT MEDICATIONS: No current outpatient medications on file. (Ophthalmic Drugs)   No current facility-administered medications for this visit. (Ophthalmic Drugs)   Current Outpatient Medications (Other)  Medication Sig  . ALPRAZolam (XANAX) 0.5 MG tablet Take 0.5 mg by mouth as needed.  . mirabegron ER (MYRBETRIQ) 25 MG TB24 tablet Take 25 mg by mouth daily.  Marland Kitchen oxyCODONE-acetaminophen (PERCOCET/ROXICET) 5-325 MG per tablet Take 2 tablets by mouth every 6 (six) hours as needed for severe pain. (Patient not taking: Reported on 06/09/2019)  . triamcinolone cream (KENALOG) 0.1 % Apply 1 application topically. Twice a week   No current facility-administered medications for this visit. (Other)      REVIEW OF SYSTEMS: ROS    Positive for: Eyes   Negative for: Constitutional, Gastrointestinal, Neurological, Skin, Genitourinary, Musculoskeletal, HENT, Endocrine, Cardiovascular, Respiratory, Psychiatric, Allergic/Imm, Heme/Lymph   Last edited by  Theodore Demark, COA on 04/18/2020  1:47 PM. (History)       ALLERGIES Allergies  Allergen Reactions  . Amoxicillin   . Erythromycin   . Penicillins     PAST MEDICAL HISTORY Past Medical History:  Diagnosis Date  . Asthma    Past Surgical History:  Procedure Laterality Date  . FOOT SURGERY    . WRIST SURGERY      FAMILY HISTORY Family History  Problem Relation Age of Onset  . Diabetes Mother   . Diabetes Maternal Aunt     SOCIAL HISTORY Social History   Tobacco Use  . Smoking status: Never Smoker  . Smokeless tobacco: Never Used  Substance Use Topics  . Alcohol use: No  . Drug use: No         OPHTHALMIC EXAM:  Base Eye Exam    Visual Acuity (Snellen - Linear)      Right Left   Dist cc 20/20 20/25 -2   Dist ph cc  NI       Tonometry (Tonopen, 1:45 PM)      Right Left   Pressure 16 15       Pupils      Dark Light Shape React APD   Right 3 2 Round Brisk None   Left 3 2 Round Brisk None       Visual Fields (Counting fingers)      Left Right    Full Full  Extraocular Movement      Right Left    Full Full       Neuro/Psych    Oriented x3: Yes   Mood/Affect: Normal       Dilation    Both eyes: 1.0% Mydriacyl, 2.5% Phenylephrine @ 1:45 PM        Slit Lamp and Fundus Exam    Slit Lamp Exam      Right Left   Lids/Lashes Dermatochalasis - upper lid, mild Meibomian gland dysfunction Dermatochalasis - upper lid, mild Meibomian gland dysfunction   Conjunctiva/Sclera White and quiet White and quiet   Cornea trace arcus, 1-2+PEE, mild tear film debris trace arcus, trace PEE, mild tear film debris   Anterior Chamber Deep and quiet Deep and quiet, no cell or flare   Iris Round and dilated, focal pigmented spot at 0900 Round and dilated   Lens 2+Nuclear sclerosis, 2-3+ Cortical 2-3+Nuclear sclerosis, 2-3+ Cortical   Vitreous Vitreous syneresis Vitreous syneresis, Posterior vitreous detachment, Weiss ring, vitreous condensations        Fundus Exam      Right Left   Disc Pink and sharp Pink and sharp, compact   C/D Ratio 0.2 0.2   Macula Flat, Blunted foveal reflex, Retinal pigment epithelial mottling, No heme or edema Flat, Blunted foveal reflex, ERM, +Central cystic changes, no heme, RPE mottling and clumping   Vessels mild attenuation, mild tortuousity mild attenuation, mild tortuousity   Periphery Attached, mild reticular degeneration, no heme Attached, mild reticular degeneration, no heme, No RT/RD 360         Refraction    Wearing Rx      Sphere Cylinder Axis Add   Right +0.50 +0.75 180 +2.50   Left [place] +0.75 180 +2.50          IMAGING AND PROCEDURES  Imaging and Procedures for _0 @  OCT, Retina - OU - Both Eyes       Right Eye Quality was good. Central Foveal Thickness: 298. Progression has been stable. Findings include normal foveal contour, no IRF, no SRF, vitreomacular adhesion .   Left Eye Quality was good. Central Foveal Thickness: 417. Progression has been stable. Findings include abnormal foveal contour, epiretinal membrane, no SRF, intraretinal fluid, macular pucker (ERM w/ persistent central cystic changes slightly improved).   Notes *Images captured and stored on drive  Diagnosis / Impression:  OD: No NFP, no IRF/SRF; +VMA -- stable OS: ERM w/ persistent central cystic changes slightly improved   Clinical management:  See below  Abbreviations: NFP - Normal foveal profile. CME - cystoid macular edema. PED - pigment epithelial detachment. IRF - intraretinal fluid. SRF - subretinal fluid. EZ - ellipsoid zone. ERM - epiretinal membrane. ORA - outer retinal atrophy. ORT - outer retinal tubulation. SRHM - subretinal hyper-reflective material                ASSESSMENT/PLAN:    ICD-10-CM   1. Vitreomacular adhesion of right eye  H43.821   2. Posterior vitreous detachment of left eye  H43.812   3. Retinal edema  H35.81 OCT, Retina - OU - Both Eyes  4. Epiretinal membrane (ERM)  of left eye  H35.372   5. 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 VMA OD; PVD OS  - stable VMA OD  - stable PVD OS w/ stable improvement in symptomatic floaters  - Discussed findings and prognosis  - No RT or RD on 360 peripheral exam  -  Reviewed s/s of RT/RD  - Strict return precautions for any such RT/RD signs/symptoms  - f/u in 6 months for DFE/OCT  4. Epiretinal membrane, OS  - moderate ERM w/ foveoschisis  - no metamorphopsia  - OCT shows interval improvement in trace cystic changes under ERM  - BCVA stable at 20/30   - no indication for surgery at this time  - monitor for now  5. Mixed cataract OU   - The symptoms of cataract, surgical options, and treatments and risks were discussed with patient.  - discussed diagnosis and progression  - pt to set up appointment with Dr. Hulan Saas for f/u   Ophthalmic Meds Ordered this visit:  No orders of the defined types were placed in this encounter.      Return in about 6 months (around 10/16/2020) for VMA OD, ERM OS - Dilated Exam, OCT.  There are no Patient Instructions on file for this visit.   Explained the diagnoses, plan, and follow up with the patient and they expressed understanding.  Patient expressed understanding of the importance of proper follow up care.   This document serves as a record of services personally performed by Gardiner Sleeper, MD, PhD. It was created on their behalf by Estill Bakes, COT an ophthalmic technician. The creation of this record is the provider's dictation and/or activities during the visit.    Electronically signed by: Estill Bakes, COT 2.3.22 @ 11:42 PM   This document serves as a record of services personally performed by Gardiner Sleeper, MD, PhD. It was created on their behalf by San Jetty. Owens Shark, OA an ophthalmic technician. The creation of this record is the provider's dictation and/or activities during the visit.    Electronically signed by: San Jetty.  Marguerita Merles 02.08.2022 11:42 PM  Gardiner Sleeper, M.D., Ph.D. Diseases & Surgery of the Retina and [place] 04/18/2020   I have reviewed the above documentation for accuracy and completeness, and I agree with the above. Gardiner Sleeper, M.D., Ph.D. 04/18/20 11:42 PM   Abbreviations: M myopia (nearsighted); A astigmatism; H hyperopia (farsighted); P presbyopia; Mrx spectacle prescription;  CTL contact lenses; OD right eye; OS left eye; OU both eyes  XT exotropia; ET esotropia; PEK punctate epithelial keratitis; PEE punctate epithelial erosions; DES dry eye syndrome; MGD meibomian gland dysfunction; ATs artificial tears; PFAT's preservative free artificial tears; [place] nuclear sclerotic cataract; PSC posterior subcapsular cataract; ERM epi-retinal membrane; PVD posterior vitreous detachment; RD retinal detachment; DM diabetes mellitus; DR diabetic retinopathy; NPDR non-proliferative diabetic retinopathy; PDR proliferative diabetic retinopathy; CSME clinically significant macular edema; DME diabetic macular edema; dbh dot blot hemorrhages; CWS cotton wool spot; POAG primary open angle glaucoma; C/D cup-to-disc ratio; HVF humphrey visual field; GVF goldmann visual field; OCT optical coherence tomography; IOP intraocular pressure; BRVO Branch retinal vein occlusion; CRVO central retinal vein occlusion; CRAO central retinal artery occlusion; BRAO branch retinal artery occlusion; RT retinal tear; SB scleral buckle; PPV pars plana vitrectomy; VH Vitreous hemorrhage; PRP panretinal laser photocoagulation; IVK intravitreal kenalog; VMT vitreomacular traction; MH Macular hole;  NVD neovascularization of the disc; NVE neovascularization elsewhere; AREDS age related eye disease study; ARMD age related macular degeneration; POAG primary open angle glaucoma; EBMD epithelial/anterior basement membrane dystrophy; ACIOL anterior chamber intraocular lens; IOL intraocular lens; PCIOL posterior  chamber intraocular lens; Phaco/IOL phacoemulsification with intraocular lens placement; [place] photorefractive keratectomy; LASIK laser assisted in situ keratomileusis; HTN hypertension; DM diabetes mellitus; COPD chronic obstructive pulmonary disease

## 2020-04-18 ENCOUNTER — Encounter (INDEPENDENT_AMBULATORY_CARE_PROVIDER_SITE_OTHER): Payer: Self-pay | Admitting: Ophthalmology

## 2020-04-18 ENCOUNTER — Ambulatory Visit (INDEPENDENT_AMBULATORY_CARE_PROVIDER_SITE_OTHER): Payer: PPO | Admitting: Ophthalmology

## 2020-04-18 ENCOUNTER — Other Ambulatory Visit: Payer: Self-pay

## 2020-04-18 DIAGNOSIS — H35372 Puckering of macula, left eye: Secondary | ICD-10-CM | POA: Diagnosis not present

## 2020-04-18 DIAGNOSIS — H43812 Vitreous degeneration, left eye: Secondary | ICD-10-CM | POA: Diagnosis not present

## 2020-04-18 DIAGNOSIS — H43821 Vitreomacular adhesion, right eye: Secondary | ICD-10-CM

## 2020-04-18 DIAGNOSIS — H3581 Retinal edema: Secondary | ICD-10-CM | POA: Diagnosis not present

## 2020-04-18 DIAGNOSIS — H25813 Combined forms of age-related cataract, bilateral: Secondary | ICD-10-CM

## 2020-04-21 ENCOUNTER — Ambulatory Visit
Admission: RE | Admit: 2020-04-21 | Discharge: 2020-04-21 | Disposition: A | Discharge status: Home / Self Care | Payer: PPO | Source: Ambulatory Visit | Attending: Obstetrics | Admitting: Obstetrics

## 2020-04-21 ENCOUNTER — Other Ambulatory Visit: Payer: Self-pay

## 2020-04-21 DIAGNOSIS — R928 Other abnormal and inconclusive findings on diagnostic imaging of breast: Secondary | ICD-10-CM

## 2020-04-21 DIAGNOSIS — R922 Inconclusive mammogram: Secondary | ICD-10-CM | POA: Diagnosis not present

## 2020-04-21 DIAGNOSIS — N6002 Solitary cyst of left breast: Secondary | ICD-10-CM | POA: Diagnosis not present

## 2020-07-05 DIAGNOSIS — H2513 Age-related nuclear cataract, bilateral: Secondary | ICD-10-CM | POA: Diagnosis not present

## 2020-07-31 DIAGNOSIS — L659 Nonscarring hair loss, unspecified: Secondary | ICD-10-CM | POA: Diagnosis not present

## 2020-07-31 DIAGNOSIS — Z01419 Encounter for gynecological examination (general) (routine) without abnormal findings: Secondary | ICD-10-CM | POA: Diagnosis not present

## 2020-10-12 NOTE — Progress Notes (Addendum)
[facility] Note  10/16/2020     CHIEF COMPLAINT Patient presents for Retina Follow Up  HISTORY OF PRESENT ILLNESS: Alexis Tate is a 70 y.o. female who presents to the clinic today for:   HPI     Retina Follow Up   Patient presents with  Other.  In both eyes.  This started 6 months ago.  I, the attending physician,  performed the HPI with the patient and updated documentation appropriately.        Comments   Patient here for 6 months retina follow up for VMA OD, ERM OS. Patient states vision about the same. No eye pain.       Last edited by Bernarda Caffey, MD on 10/16/2020  8:33 AM.     pt states vision has decreased since she was here last, she recently saw Dr. Hulan Saas who said her cataracts are not ready to be removed yet  Referring physician: Valente David, [place],  [place] [address]  HISTORICAL INFORMATION:   Selected notes from the MEDICAL RECORD NUMBER Referred by Dr. Hulan Saas for concern of mac hole OS   CURRENT MEDICATIONS: No current outpatient medications on file. (Ophthalmic Drugs)   No current facility-administered medications for this visit. (Ophthalmic Drugs)   Current Outpatient Medications (Other)  Medication Sig   ALPRAZolam (XANAX) 0.5 MG tablet Take 0.5 mg by mouth as needed.   mirabegron ER (MYRBETRIQ) 25 MG TB24 tablet Take 25 mg by mouth daily.   oxyCODONE-acetaminophen (PERCOCET/ROXICET) 5-325 MG per tablet Take 2 tablets by mouth every 6 (six) hours as needed for severe pain. (Patient not taking: Reported on 06/09/2019)   triamcinolone cream (KENALOG) 0.1 % Apply 1 application topically. Twice a week   No current facility-administered medications for this visit. (Other)      REVIEW OF SYSTEMS: ROS   Positive for: Eyes Negative for: Constitutional, Gastrointestinal, Neurological, Skin, Genitourinary, Musculoskeletal, HENT, Endocrine, Cardiovascular, Respiratory, Psychiatric, Allergic/Imm,  Heme/Lymph Last edited by Theodore Demark, COA on 10/16/2020  8:29 AM.        ALLERGIES Allergies  Allergen Reactions   Amoxicillin    Erythromycin    Penicillins     PAST MEDICAL HISTORY Past Medical History:  Diagnosis Date   Asthma    Past Surgical History:  Procedure Laterality Date   FOOT SURGERY     WRIST SURGERY      FAMILY HISTORY Family History  Problem Relation Age of Onset   Diabetes Mother    Diabetes Maternal Aunt     SOCIAL HISTORY Social History   Tobacco Use   Smoking status: Never   Smokeless tobacco: Never  Substance Use Topics   Alcohol use: No   Drug use: No         OPHTHALMIC EXAM:  Base Eye Exam     Visual Acuity (Snellen - Linear)       Right Left   Dist cc 20/25 -1 20/30 -2   Dist ph cc NI NI    Correction: Glasses         Tonometry (Tonopen, 8:27 AM)       Right Left   Pressure 10 07         Pupils       Dark Light Shape React APD   Right 3 2 Round Brisk None   Left 3 2 Round Brisk None         Visual Fields (Counting fingers)  Left Right    Full Full         Extraocular Movement       Right Left    Full Full         Neuro/Psych     Oriented x3: Yes   Mood/Affect: Normal         Dilation     Both eyes: 1.0% Mydriacyl, 2.5% Phenylephrine @ 8:26 AM           Slit Lamp and Fundus Exam     Slit Lamp Exam       Right Left   Lids/Lashes Dermatochalasis - upper lid, mild Meibomian gland dysfunction Dermatochalasis - upper lid, mild Meibomian gland dysfunction   Conjunctiva/Sclera White and quiet White and quiet   Cornea trace arcus, 1+PEE, mild tear film debris trace arcus, trace PEE, mild tear film debris   Anterior Chamber Deep and quiet Deep and quiet, no cell or flare   Iris Round and dilated, focal pigmented spot at 0900 Round and dilated   Lens 2+Nuclear sclerosis, 2-3+ Cortical 2-3+Nuclear sclerosis, 2-3+ Cortical   Vitreous Vitreous syneresis Vitreous syneresis,  Posterior vitreous detachment, Weiss ring, vitreous condensations         Fundus Exam       Right Left   Disc Pink and sharp, +PPP Pink and sharp, compact   C/D Ratio 0.2 0.2   Macula Flat, Blunted foveal reflex, Retinal pigment epithelial mottling, No heme or edema Flat, Blunted foveal reflex, ERM, +Central cystic changes, no heme, RPE mottling and clumping   Vessels mild attenuation, mild tortuousity mild attenuation, mild tortuousity   Periphery Attached, mild reticular degeneration, no heme Attached, mild reticular degeneration, no heme, No RT/RD 360            Refraction     Wearing Rx       Sphere Cylinder Axis Add   Right +0.50 +0.75 180 +2.50   Left [place] +0.75 180 +2.50            IMAGING AND PROCEDURES  Imaging and Procedures for _0 @  OCT, Retina - OU - Both Eyes       Right Eye Quality was good. Central Foveal Thickness: 306. Progression has been stable. Findings include normal foveal contour, no IRF, no SRF, vitreomacular adhesion (Interval progression of VMA release -- central adhesion now just at fovea).   Left Eye Quality was good. Central Foveal Thickness: 432. Progression has worsened. Findings include abnormal foveal contour, epiretinal membrane, no SRF, intraretinal fluid, macular pucker (Mild interval thickening of ERM and progression of pucker, persistent central cystic changes / schisis).   Notes *Images captured and stored on drive  Diagnosis / Impression:  OD: NFP, no IRF/SRF; Interval progression of VMA release -- central adhesion now just at fovea OS: Mild interval thickening of ERM and progression of pucker, persistent central cystic changes / schisis   Clinical management:  See below  Abbreviations: NFP - Normal foveal profile. CME - cystoid macular edema. PED - pigment epithelial detachment. IRF - intraretinal fluid. SRF - subretinal fluid. EZ - ellipsoid zone. ERM - epiretinal membrane. ORA - outer retinal atrophy. ORT - outer  retinal tubulation. SRHM - subretinal hyper-reflective material              ASSESSMENT/PLAN:    ICD-10-CM   1. Vitreomacular adhesion of right eye  H43.821     2. Posterior vitreous detachment of left eye  H43.812     3. Retinal edema  H35.81 OCT, Retina - OU - Both Eyes    4. Epiretinal membrane (ERM) of left eye  H35.372     5. Combined forms of age-related cataract of both eyes  H25.813       1-3. VMA OD; PVD OS  - mild progression of VMA release OD -- OCT shows central adhesion just at fovea  - stable PVD OS w/ stable improvement in symptomatic floaters  - Discussed findings and prognosis  - No RT or RD on 360 peripheral exam  - Reviewed s/s of RT/RD  - Strict return precautions for any such RT/RD signs/symptoms  - f/u in 6 months for DFE/OCT  4. Epiretinal membrane, OS  - moderate ERM w/ foveoschisis -- mild thickening of ERM  - no metamorphopsia  - OCT shows Mild interval thickening of ERM and progression of pucker, persistent central cystic changes / schisis  - BCVA stable at 20/30   - no indication for surgery at this time  - monitor for now  5. Mixed cataract OU   - The symptoms of cataract, surgical options, and treatments and risks were discussed with patient.  - discussed diagnosis and progression  - pt reports significant symptoms with driving   Ophthalmic Meds Ordered this visit:  No orders of the defined types were placed in this encounter.      Return in about 6 months (around 04/18/2021) for f/u VMA OD, DFE, OCT.  There are no Patient Instructions on file for this visit.  This document serves as a record of services personally performed by Gardiner Sleeper, MD, PhD. It was created on their behalf by Leeann Must, [place], an ophthalmic technician. The creation of this record is the provider's dictation and/or activities during the visit.    Electronically signed by: Leeann Must, COA _0 @ 9:13 AM  This document serves as a record of  services personally performed by Gardiner Sleeper, MD, PhD. It was created on their behalf by San Jetty. Owens Shark, OA an ophthalmic technician. The creation of this record is the provider's dictation and/or activities during the visit.    Electronically signed by: San Jetty. Marguerita Merles 08.08.2022 9:13 AM  Gardiner Sleeper, M.D., Ph.D. Diseases & Surgery of the Retina and [place] 10/16/2020   I have reviewed the above documentation for accuracy and completeness, and I agree with the above. Gardiner Sleeper, M.D., Ph.D. 10/16/20 9:13 AM  Abbreviations: M myopia (nearsighted); A astigmatism; H hyperopia (farsighted); P presbyopia; Mrx spectacle prescription;  CTL contact lenses; OD right eye; OS left eye; OU both eyes  XT exotropia; ET esotropia; PEK punctate epithelial keratitis; PEE punctate epithelial erosions; DES dry eye syndrome; MGD meibomian gland dysfunction; ATs artificial tears; PFAT's preservative free artificial tears; [place] nuclear sclerotic cataract; PSC posterior subcapsular cataract; ERM epi-retinal membrane; PVD posterior vitreous detachment; RD retinal detachment; DM diabetes mellitus; DR diabetic retinopathy; NPDR non-proliferative diabetic retinopathy; PDR proliferative diabetic retinopathy; CSME clinically significant macular edema; DME diabetic macular edema; dbh dot blot hemorrhages; CWS cotton wool spot; POAG primary open angle glaucoma; C/D cup-to-disc ratio; HVF humphrey visual field; GVF goldmann visual field; OCT optical coherence tomography; IOP intraocular pressure; BRVO Branch retinal vein occlusion; CRVO central retinal vein occlusion; CRAO central retinal artery occlusion; BRAO branch retinal artery occlusion; RT retinal tear; SB scleral buckle; PPV pars plana vitrectomy; VH Vitreous hemorrhage; PRP panretinal laser photocoagulation; IVK intravitreal kenalog; VMT vitreomacular traction; MH Macular hole;  NVD neovascularization of the disc;  NVE  neovascularization elsewhere; AREDS age related eye disease study; ARMD age related macular degeneration; POAG primary open angle glaucoma; EBMD epithelial/anterior basement membrane dystrophy; ACIOL anterior chamber intraocular lens; IOL intraocular lens; PCIOL posterior chamber intraocular lens; Phaco/IOL phacoemulsification with intraocular lens placement; [place] photorefractive keratectomy; LASIK laser assisted in situ keratomileusis; HTN hypertension; DM diabetes mellitus; COPD chronic obstructive pulmonary disease

## 2020-10-16 ENCOUNTER — Encounter (INDEPENDENT_AMBULATORY_CARE_PROVIDER_SITE_OTHER): Payer: Self-pay | Admitting: Ophthalmology

## 2020-10-16 ENCOUNTER — Ambulatory Visit (INDEPENDENT_AMBULATORY_CARE_PROVIDER_SITE_OTHER): Payer: PPO | Admitting: Ophthalmology

## 2020-10-16 ENCOUNTER — Other Ambulatory Visit: Payer: Self-pay

## 2020-10-16 DIAGNOSIS — H35372 Puckering of macula, left eye: Secondary | ICD-10-CM

## 2020-10-16 DIAGNOSIS — H43812 Vitreous degeneration, left eye: Secondary | ICD-10-CM | POA: Diagnosis not present

## 2020-10-16 DIAGNOSIS — H3581 Retinal edema: Secondary | ICD-10-CM

## 2020-10-16 DIAGNOSIS — H25813 Combined forms of age-related cataract, bilateral: Secondary | ICD-10-CM | POA: Diagnosis not present

## 2020-10-16 DIAGNOSIS — H43823 Vitreomacular adhesion, bilateral: Secondary | ICD-10-CM

## 2020-10-16 DIAGNOSIS — H43821 Vitreomacular adhesion, right eye: Secondary | ICD-10-CM | POA: Diagnosis not present

## 2021-02-06 DIAGNOSIS — M1711 Unilateral primary osteoarthritis, right knee: Secondary | ICD-10-CM | POA: Diagnosis not present

## 2021-02-06 DIAGNOSIS — M25561 Pain in right knee: Secondary | ICD-10-CM | POA: Diagnosis not present

## 2021-03-20 DIAGNOSIS — J329 Chronic sinusitis, unspecified: Secondary | ICD-10-CM | POA: Diagnosis not present

## 2021-03-20 DIAGNOSIS — Z20828 Contact with and (suspected) exposure to other viral communicable diseases: Secondary | ICD-10-CM | POA: Diagnosis not present

## 2021-03-20 DIAGNOSIS — J40 Bronchitis, not specified as acute or chronic: Secondary | ICD-10-CM | POA: Diagnosis not present

## 2021-03-23 DIAGNOSIS — Z6832 Body mass index (BMI) 32.0-32.9, adult: Secondary | ICD-10-CM | POA: Diagnosis not present

## 2021-03-23 DIAGNOSIS — J45901 Unspecified asthma with (acute) exacerbation: Secondary | ICD-10-CM | POA: Diagnosis not present

## 2021-03-23 DIAGNOSIS — J329 Chronic sinusitis, unspecified: Secondary | ICD-10-CM | POA: Diagnosis not present

## 2021-03-23 DIAGNOSIS — Z20828 Contact with and (suspected) exposure to other viral communicable diseases: Secondary | ICD-10-CM | POA: Diagnosis not present

## 2021-03-23 DIAGNOSIS — J40 Bronchitis, not specified as acute or chronic: Secondary | ICD-10-CM | POA: Diagnosis not present

## 2021-04-02 DIAGNOSIS — J45901 Unspecified asthma with (acute) exacerbation: Secondary | ICD-10-CM | POA: Diagnosis not present

## 2021-04-02 DIAGNOSIS — R509 Fever, unspecified: Secondary | ICD-10-CM | POA: Diagnosis not present

## 2021-04-02 DIAGNOSIS — J22 Unspecified acute lower respiratory infection: Secondary | ICD-10-CM | POA: Diagnosis not present

## 2021-04-02 DIAGNOSIS — Z6832 Body mass index (BMI) 32.0-32.9, adult: Secondary | ICD-10-CM | POA: Diagnosis not present

## 2021-04-05 DIAGNOSIS — R918 Other nonspecific abnormal finding of lung field: Secondary | ICD-10-CM | POA: Diagnosis not present

## 2021-04-05 DIAGNOSIS — J189 Pneumonia, unspecified organism: Secondary | ICD-10-CM | POA: Diagnosis not present

## 2021-04-05 DIAGNOSIS — I700 Atherosclerosis of aorta: Secondary | ICD-10-CM | POA: Diagnosis not present

## 2021-04-05 DIAGNOSIS — Q676 Pectus excavatum: Secondary | ICD-10-CM | POA: Diagnosis not present

## 2021-04-10 DIAGNOSIS — Z9181 History of falling: Secondary | ICD-10-CM | POA: Diagnosis not present

## 2021-04-10 DIAGNOSIS — J189 Pneumonia, unspecified organism: Secondary | ICD-10-CM | POA: Diagnosis not present

## 2021-04-10 DIAGNOSIS — R918 Other nonspecific abnormal finding of lung field: Secondary | ICD-10-CM | POA: Diagnosis not present

## 2021-04-10 DIAGNOSIS — Z1331 Encounter for screening for depression: Secondary | ICD-10-CM | POA: Diagnosis not present

## 2021-04-10 DIAGNOSIS — Z6832 Body mass index (BMI) 32.0-32.9, adult: Secondary | ICD-10-CM | POA: Diagnosis not present

## 2021-04-12 NOTE — Progress Notes (Addendum)
[facility] Note  04/16/2021     CHIEF COMPLAINT Patient presents for Retina Follow Up   HISTORY OF PRESENT ILLNESS: Alexis Tate is a 71 y.o. female who presents to the clinic today for:   HPI     Retina Follow Up   Patient presents with  Other.  In right eye.  Severity is moderate.  Duration of 6 months.  Since onset it is stable.  I, the attending physician,  performed the HPI with the patient and updated documentation appropriately.        Comments   Pt here for 6 mo ret f/u for vitreomacular adhesion OD. Pt states VA is the same, no changes.       Last edited by Bernarda Caffey, MD on 04/18/2021 11:50 AM.    pt states she still has a floater in her left eye vision  Referring physician: Valente David, [place],  [place] [address]  HISTORICAL INFORMATION:   Selected notes from the MEDICAL RECORD NUMBER Referred by Dr. Hulan Saas for concern of mac hole OS   CURRENT MEDICATIONS: No current outpatient medications on file. (Ophthalmic Drugs)   No current facility-administered medications for this visit. (Ophthalmic Drugs)   Current Outpatient Medications (Other)  Medication Sig   ALPRAZolam (XANAX) 0.5 MG tablet Take 0.5 mg by mouth as needed.   mirabegron ER (MYRBETRIQ) 25 MG TB24 tablet Take 25 mg by mouth daily.   triamcinolone cream (KENALOG) 0.1 % Apply 1 application topically. Twice a week   oxyCODONE-acetaminophen (PERCOCET/ROXICET) 5-325 MG per tablet Take 2 tablets by mouth every 6 (six) hours as needed for severe pain. (Patient not taking: Reported on 06/09/2019)   No current facility-administered medications for this visit. (Other)   REVIEW OF SYSTEMS: ROS   Positive for: Eyes Negative for: Constitutional, Gastrointestinal, Neurological, Skin, Genitourinary, Musculoskeletal, HENT, Endocrine, Cardiovascular, Respiratory, Psychiatric, Allergic/Imm, Heme/Lymph Last edited by Kingsley Spittle, COT on 04/16/2021  9:28  AM.     ALLERGIES Allergies  Allergen Reactions   Amoxicillin    Erythromycin    Penicillins    PAST MEDICAL HISTORY Past Medical History:  Diagnosis Date   Asthma    Past Surgical History:  Procedure Laterality Date   FOOT SURGERY     WRIST SURGERY     FAMILY HISTORY Family History  Problem Relation Age of Onset   Diabetes Mother    Diabetes Maternal Aunt    SOCIAL HISTORY Social History   Tobacco Use   Smoking status: Never   Smokeless tobacco: Never  Substance Use Topics   Alcohol use: No   Drug use: No       OPHTHALMIC EXAM:  Base Eye Exam     Visual Acuity (Snellen - Linear)       Right Left   Dist cc 20/25 -2 20/30   Dist ph cc NI NI    Correction: Glasses         Tonometry (Tonopen, 9:34 AM)       Right Left   Pressure 10 11         Pupils       Dark Light Shape React APD   Right 3 2 Round Brisk None   Left 3 2 Round Brisk None         Visual Fields (Counting fingers)       Left Right    Full Full  Extraocular Movement       Right Left    Full, Ortho Full, Ortho         Neuro/Psych     Oriented x3: Yes   Mood/Affect: Normal         Dilation     Both eyes: 1.0% Mydriacyl, 2.5% Phenylephrine @ 9:35 AM           Slit Lamp and Fundus Exam     Slit Lamp Exam       Right Left   Lids/Lashes Dermatochalasis - upper lid, mild Meibomian gland dysfunction Dermatochalasis - upper lid, mild Meibomian gland dysfunction   Conjunctiva/Sclera White and quiet White and quiet   Cornea trace arcus, trace PEE, mild tear film debris trace arcus, trace PEE, mild tear film debris   Anterior Chamber Deep and quiet Deep and quiet, no cell or flare   Iris Round and dilated, focal pigmented spot at 0900 Round and dilated   Lens 2+Nuclear sclerosis, 2-3+ Cortical 2-3+Nuclear sclerosis, 2-3+ Cortical   Anterior Vitreous Vitreous syneresis Vitreous syneresis, Posterior vitreous detachment, Weiss ring, vitreous  condensations         Fundus Exam       Right Left   Disc Pink and sharp, +PPP Pink and sharp, compact   C/D Ratio 0.2 0.1   Macula Flat, Blunted foveal reflex, Retinal pigment epithelial mottling, No heme or edema Flat, Blunted foveal reflex, ERM, +Central cystic changes, no heme, RPE mottling and clumping   Vessels mild attenuation, mild tortuousity mild attenuation, mild tortuousity   Periphery Attached, mild reticular degeneration, no heme Attached, mild reticular degeneration, no heme, No RT/RD 360           Refraction     Wearing Rx       Sphere Cylinder Axis Add   Right +0.50 +0.75 180 +2.50   Left [place] +0.75 180 +2.50            IMAGING AND PROCEDURES  Imaging and Procedures for _0 @  OCT, Retina - OU - Both Eyes       Right Eye Quality was good. Central Foveal Thickness: 305. Progression has worsened. Findings include no IRF, no SRF, vitreomacular adhesion , abnormal foveal contour (Interval progression of VMA release -- central adhesion now just at fovea -- mild increase in central traction).   Left Eye Quality was good. Central Foveal Thickness: 410. Progression has been stable. Findings include abnormal foveal contour, epiretinal membrane, no SRF, intraretinal fluid, macular pucker (Persistent ERM and pucker -- stable, persistent central cystic changes / schisis).   Notes *Images captured and stored on drive  Diagnosis / Impression:  OD: NFP, no IRF/SRF; Interval progression of VMA release -- central adhesion now just at fovea -- mild increase in central traction OS: Persistent ERM and pucker -- stable, persistent central cystic changes / schisis   Clinical management:  See below  Abbreviations: NFP - Normal foveal profile. CME - cystoid macular edema. PED - pigment epithelial detachment. IRF - intraretinal fluid. SRF - subretinal fluid. EZ - ellipsoid zone. ERM - epiretinal membrane. ORA - outer retinal atrophy. ORT - outer retinal tubulation.  SRHM - subretinal hyper-reflective material           ASSESSMENT/PLAN:    ICD-10-CM   1. Vitreomacular adhesion of right eye  H43.821 OCT, Retina - OU - Both Eyes    2. Posterior vitreous detachment of left eye  H43.812     3. Epiretinal membrane (ERM) of left  eye  H35.372     4. Combined forms of age-related cataract of both eyes  H25.813      1,2. VMA OD; PVD OS  - mild progression of VMA release OD -- OCT shows stable central adhesion at fovea -- mild increase in central traction  - stable PVD OS w/ stable improvement in symptomatic floaters  - Discussed findings and prognosis  - No RT or RD on 360 peripheral exam  - Reviewed s/s of RT/RD  - Strict return precautions for any such RT/RD signs/symptoms  - f/u in 6 months for DFE/OCT  3. Epiretinal membrane, OS  - moderate ERM w/ foveoschisis -- mild thickening of ERM  - no metamorphopsia  - OCT shows Persistent ERM and pucker -- stable, persistent central cystic changes / schisis  - BCVA stable at 20/30   - no indication for surgery at this time  - monitor for now  4. Mixed cataract OU   - The symptoms of cataract, surgical options, and treatments and risks were discussed with patient.  - discussed diagnosis and progression  - pt reports significant symptoms with driving   Ophthalmic Meds Ordered this visit:  No orders of the defined types were placed in this encounter.    Return in about 6 months (around 10/14/2021) for f/u VMA OD, DFE, OCT.  There are no Patient Instructions on file for this visit.  This document serves as a record of services personally performed by Gardiner Sleeper, MD, PhD. It was created on their behalf by Roselee Nova, COMT. The creation of this record is the provider's dictation and/or activities during the visit.  Electronically signed by: Roselee Nova, COMT 04/18/21 12:09 PM  This document serves as a record of services personally performed by Gardiner Sleeper, MD, PhD. It was created on  their behalf by San Jetty. Owens Shark, OA an ophthalmic technician. The creation of this record is the provider's dictation and/or activities during the visit.    Electronically signed by: San Jetty. Owens Shark, [place] 02.06.2023 12:09 PM   Gardiner Sleeper, M.D., Ph.D. Diseases & Surgery of the Retina and Vitreous [facility]  I have reviewed the above documentation for accuracy and completeness, and I agree with the above. Gardiner Sleeper, M.D., Ph.D. 04/18/21 12:09 PM  Abbreviations: M myopia (nearsighted); A astigmatism; H hyperopia (farsighted); P presbyopia; Mrx spectacle prescription;  CTL contact lenses; OD right eye; OS left eye; OU both eyes  XT exotropia; ET esotropia; PEK punctate epithelial keratitis; PEE punctate epithelial erosions; DES dry eye syndrome; MGD meibomian gland dysfunction; ATs artificial tears; PFAT's preservative free artificial tears; [place] nuclear sclerotic cataract; PSC posterior subcapsular cataract; ERM epi-retinal membrane; PVD posterior vitreous detachment; RD retinal detachment; DM diabetes mellitus; DR diabetic retinopathy; NPDR non-proliferative diabetic retinopathy; PDR proliferative diabetic retinopathy; CSME clinically significant macular edema; DME diabetic macular edema; dbh dot blot hemorrhages; CWS cotton wool spot; POAG primary open angle glaucoma; C/D cup-to-disc ratio; HVF humphrey visual field; GVF goldmann visual field; OCT optical coherence tomography; IOP intraocular pressure; BRVO Branch retinal vein occlusion; CRVO central retinal vein occlusion; CRAO central retinal artery occlusion; BRAO branch retinal artery occlusion; RT retinal tear; SB scleral buckle; PPV pars plana vitrectomy; VH Vitreous hemorrhage; PRP panretinal laser photocoagulation; IVK intravitreal kenalog; VMT vitreomacular traction; MH Macular hole;  NVD neovascularization of the disc; NVE neovascularization elsewhere; AREDS age related eye disease study; ARMD age related macular  degeneration; POAG primary open angle glaucoma; EBMD epithelial/anterior basement  membrane dystrophy; ACIOL anterior chamber intraocular lens; IOL intraocular lens; PCIOL posterior chamber intraocular lens; Phaco/IOL phacoemulsification with intraocular lens placement; [place] photorefractive keratectomy; LASIK laser assisted in situ keratomileusis; HTN hypertension; DM diabetes mellitus; COPD chronic obstructive pulmonary disease

## 2021-04-16 ENCOUNTER — Encounter (INDEPENDENT_AMBULATORY_CARE_PROVIDER_SITE_OTHER): Payer: Self-pay | Admitting: Ophthalmology

## 2021-04-16 ENCOUNTER — Ambulatory Visit (INDEPENDENT_AMBULATORY_CARE_PROVIDER_SITE_OTHER): Payer: PPO | Admitting: Ophthalmology

## 2021-04-16 ENCOUNTER — Other Ambulatory Visit: Payer: Self-pay

## 2021-04-16 DIAGNOSIS — H43812 Vitreous degeneration, left eye: Secondary | ICD-10-CM | POA: Diagnosis not present

## 2021-04-16 DIAGNOSIS — H43821 Vitreomacular adhesion, right eye: Secondary | ICD-10-CM | POA: Diagnosis not present

## 2021-04-16 DIAGNOSIS — H35372 Puckering of macula, left eye: Secondary | ICD-10-CM | POA: Diagnosis not present

## 2021-04-16 DIAGNOSIS — H25813 Combined forms of age-related cataract, bilateral: Secondary | ICD-10-CM | POA: Diagnosis not present

## 2021-04-18 ENCOUNTER — Encounter (INDEPENDENT_AMBULATORY_CARE_PROVIDER_SITE_OTHER): Payer: Self-pay | Admitting: Ophthalmology

## 2021-05-11 DIAGNOSIS — R918 Other nonspecific abnormal finding of lung field: Secondary | ICD-10-CM | POA: Diagnosis not present

## 2021-05-15 DIAGNOSIS — Z6833 Body mass index (BMI) 33.0-33.9, adult: Secondary | ICD-10-CM | POA: Diagnosis not present

## 2021-05-15 DIAGNOSIS — I700 Atherosclerosis of aorta: Secondary | ICD-10-CM | POA: Diagnosis not present

## 2021-05-15 DIAGNOSIS — J189 Pneumonia, unspecified organism: Secondary | ICD-10-CM | POA: Diagnosis not present

## 2021-05-15 DIAGNOSIS — J45909 Unspecified asthma, uncomplicated: Secondary | ICD-10-CM | POA: Diagnosis not present

## 2021-06-21 DIAGNOSIS — Z9889 Other specified postprocedural states: Secondary | ICD-10-CM | POA: Diagnosis not present

## 2021-06-21 DIAGNOSIS — M1711 Unilateral primary osteoarthritis, right knee: Secondary | ICD-10-CM | POA: Diagnosis not present

## 2021-06-29 DIAGNOSIS — J45909 Unspecified asthma, uncomplicated: Secondary | ICD-10-CM | POA: Diagnosis not present

## 2021-06-29 DIAGNOSIS — Z0000 Encounter for general adult medical examination without abnormal findings: Secondary | ICD-10-CM | POA: Diagnosis not present

## 2021-06-29 DIAGNOSIS — Z79899 Other long term (current) drug therapy: Secondary | ICD-10-CM | POA: Diagnosis not present

## 2021-06-29 DIAGNOSIS — Z780 Asymptomatic menopausal state: Secondary | ICD-10-CM | POA: Diagnosis not present

## 2021-06-29 DIAGNOSIS — I700 Atherosclerosis of aorta: Secondary | ICD-10-CM | POA: Diagnosis not present

## 2021-06-29 DIAGNOSIS — E669 Obesity, unspecified: Secondary | ICD-10-CM | POA: Diagnosis not present

## 2021-06-29 DIAGNOSIS — Z6833 Body mass index (BMI) 33.0-33.9, adult: Secondary | ICD-10-CM | POA: Diagnosis not present

## 2021-06-29 DIAGNOSIS — R7309 Other abnormal glucose: Secondary | ICD-10-CM | POA: Diagnosis not present

## 2021-08-10 DIAGNOSIS — N3941 Urge incontinence: Secondary | ICD-10-CM | POA: Diagnosis not present

## 2021-08-10 DIAGNOSIS — L900 Lichen sclerosus et atrophicus: Secondary | ICD-10-CM | POA: Diagnosis not present

## 2021-08-24 DIAGNOSIS — Z7689 Persons encountering health services in other specified circumstances: Secondary | ICD-10-CM | POA: Diagnosis not present

## 2021-08-24 DIAGNOSIS — Z1231 Encounter for screening mammogram for malignant neoplasm of breast: Secondary | ICD-10-CM | POA: Diagnosis not present

## 2021-10-09 NOTE — Progress Notes (Signed)
[facility] Note  10/15/2021     CHIEF COMPLAINT Patient presents for Retina Follow Up   HISTORY OF PRESENT ILLNESS: Alexis Tate is a 71 y.o. female who presents to the clinic today for:   HPI     Retina Follow Up   Patient presents with  Other.  In right eye.  This started 6 months ago.  I, the attending physician,  performed the HPI with the patient and updated documentation appropriately.        Comments   Patient here for 6 months retina follow up for VMA OD. Patient states vision doing good. No eye pain.       Last edited by Bernarda Caffey, MD on 10/15/2021 11:40 PM.    Pt states she has not noticed a change in vision, she does have floaters that bother her  Referring physician: Mateo Flow, MD [place],  [place] [address]  HISTORICAL INFORMATION:   Selected notes from the MEDICAL RECORD NUMBER Referred by Dr. Hulan Saas for concern of mac hole OS   CURRENT MEDICATIONS: No current outpatient medications on file. (Ophthalmic Drugs)   No current facility-administered medications for this visit. (Ophthalmic Drugs)   Current Outpatient Medications (Other)  Medication Sig   ALPRAZolam (XANAX) 0.5 MG tablet Take 0.5 mg by mouth as needed.   mirabegron ER (MYRBETRIQ) 25 MG TB24 tablet Take 25 mg by mouth daily.   oxyCODONE-acetaminophen (PERCOCET/ROXICET) 5-325 MG per tablet Take 2 tablets by mouth every 6 (six) hours as needed for severe pain. (Patient not taking: Reported on 06/09/2019)   triamcinolone cream (KENALOG) 0.1 % Apply 1 application topically. Twice a week   No current facility-administered medications for this visit. (Other)   REVIEW OF SYSTEMS: ROS   Positive for: Eyes Negative for: Constitutional, Gastrointestinal, Neurological, Skin, Genitourinary, Musculoskeletal, HENT, Endocrine, Cardiovascular, Respiratory, Psychiatric, Allergic/Imm, Heme/Lymph Last edited by Theodore Demark, COA on 10/15/2021  9:51  AM.      ALLERGIES Allergies  Allergen Reactions   Amoxicillin    Erythromycin    Penicillins    PAST MEDICAL HISTORY Past Medical History:  Diagnosis Date   Asthma    Past Surgical History:  Procedure Laterality Date   FOOT SURGERY     WRIST SURGERY     FAMILY HISTORY Family History  Problem Relation Age of Onset   Diabetes Mother    Diabetes Maternal Aunt    SOCIAL HISTORY Social History   Tobacco Use   Smoking status: Never   Smokeless tobacco: Never  Vaping Use   Vaping Use: Never used  Substance Use Topics   Alcohol use: No   Drug use: No       OPHTHALMIC EXAM:  Base Eye Exam     Visual Acuity (Snellen - Linear)       Right Left   Dist cc 20/25 -2 20/20 -2   Dist ph cc NI     Correction: Glasses         Tonometry (Tonopen, 9:49 AM)       Right Left   Pressure 13 13         Pupils       Dark Light Shape React APD   Right 3 2 Round Brisk None   Left 3 2 Round Brisk None         Visual Fields (Counting fingers)       Left Right    Full Full  Extraocular Movement       Right Left    Full, Ortho Full, Ortho         Neuro/Psych     Oriented x3: Yes   Mood/Affect: Normal         Dilation     Both eyes: 1.0% Mydriacyl, 2.5% Phenylephrine @ 9:49 AM           Slit Lamp and Fundus Exam     Slit Lamp Exam       Right Left   Lids/Lashes Dermatochalasis - upper lid, mild Meibomian gland dysfunction Dermatochalasis - upper lid, mild Meibomian gland dysfunction   Conjunctiva/Sclera White and quiet White and quiet   Cornea trace arcus, trace PEE, mild tear film debris trace arcus, trace PEE, mild tear film debris   Anterior Chamber Deep and quiet Deep and quiet, no cell or flare   Iris Round and dilated, focal pigmented spot at 0900 Round and dilated   Lens 2+Nuclear sclerosis, 2-3+ Cortical 2-3+Nuclear sclerosis, 2-3+ Cortical   Anterior Vitreous Vitreous syneresis Vitreous syneresis, Posterior vitreous  detachment, Weiss ring, vitreous condensations         Fundus Exam       Right Left   Disc Pink and sharp, +PPP Pink and sharp, compact   C/D Ratio 0.2 0.1   Macula Flat, Blunted foveal reflex, Retinal pigment epithelial mottling, No heme or edema Flat, Blunted foveal reflex, ERM, +Central cystic changes, no heme, RPE mottling and clumping   Vessels mild attenuation, mild tortuosity attenuated, Tortuous   Periphery Attached, mild reticular degeneration, no heme Attached, mild reticular degeneration, no heme, No RT/RD           Refraction     Wearing Rx       Sphere Cylinder Axis Add   Right +0.50 +0.75 180 +2.50   Left [place] +0.75 180 +2.50            IMAGING AND PROCEDURES  Imaging and Procedures for _0 @  OCT, Retina - OU - Both Eyes       Right Eye Quality was good. Central Foveal Thickness: 308. Progression has been stable. Findings include no IRF, no SRF, abnormal foveal contour, vitreomacular adhesion (Mild interval progression of VMA release -- central adhesion now just at fovea ).   Left Eye Quality was good. Central Foveal Thickness: 403. Progression has been stable. Findings include no SRF, abnormal foveal contour, epiretinal membrane, intraretinal fluid, macular pucker (Persistent ERM and pucker -- stable, persistent central cystic changes / schisis).   Notes *Images captured and stored on drive  Diagnosis / Impression:  OD: NFP, no IRF/SRF; Mild interval progression of VMA release -- central adhesion now just at fovea  OS: Persistent ERM and pucker -- stable, persistent central cystic changes / schisis  Clinical management:  See below  Abbreviations: NFP - Normal foveal profile. CME - cystoid macular edema. PED - pigment epithelial detachment. IRF - intraretinal fluid. SRF - subretinal fluid. EZ - ellipsoid zone. ERM - epiretinal membrane. ORA - outer retinal atrophy. ORT - outer retinal tubulation. SRHM - subretinal hyper-reflective material            ASSESSMENT/PLAN:    ICD-10-CM   1. Vitreomacular adhesion of right eye  H43.821 OCT, Retina - OU - Both Eyes    2. Posterior vitreous detachment of left eye  H43.812     3. Epiretinal membrane (ERM) of left eye  H35.372 OCT, Retina - OU - Both Eyes  4. Combined forms of age-related cataract of both eyes  H25.813      1,2. VMA OD; PVD OS  - stable VMA OD -- OCT shows stable central adhesion at fovea   - stable PVD OS w/ stable improvement in symptomatic floaters  - Discussed findings and prognosis   - No RT or RD on 360 peripheral exam  - Reviewed s/s of RT/RD  - Strict return precautions for any such RT/RD signs/symptoms  - f/u in 6 months for DFE/OCT  3. Epiretinal membrane, OS  - moderate ERM w/ foveoschisis -- no significant change from prior  - no metamorphopsia  - OCT shows Persistent ERM and pucker -- stable, persistent central cystic changes / schisis  - BCVA 20/20 OS from 20/30  - no indication for surgery at this time  - monitor for now  4. Mixed cataract OU   - The symptoms of cataract, surgical options, and treatments and risks were discussed with patient.  - discussed diagnosis and progression  - pt reports significant symptoms with driving   Ophthalmic Meds Ordered this visit:  No orders of the defined types were placed in this encounter.    Return in about 6 months (around 04/17/2022) for f/u VMA OD, DFE, OCT.  There are no Patient Instructions on file for this visit.  This document serves as a record of services personally performed by Gardiner Sleeper, MD, PhD. It was created on their behalf by Orvan Falconer, an ophthalmic technician. The creation of this record is the provider's dictation and/or activities during the visit.    Electronically signed by: Orvan Falconer, OA, 10/15/21  11:40 PM  This document serves as a record of services personally performed by Gardiner Sleeper, MD, PhD. It was created on their behalf by San Jetty. Owens Shark,  OA an ophthalmic technician. The creation of this record is the provider's dictation and/or activities during the visit.    Electronically signed by: San Jetty. Owens Shark, [place] 08.07.2023 11:40 PM   Gardiner Sleeper, M.D., Ph.D. Diseases & Surgery of the Retina and Vitreous [facility]  I have reviewed the above documentation for accuracy and completeness, and I agree with the above. Gardiner Sleeper, M.D., Ph.D. 10/15/21 11:42 PM  Abbreviations: M myopia (nearsighted); A astigmatism; H hyperopia (farsighted); P presbyopia; Mrx spectacle prescription;  CTL contact lenses; OD right eye; OS left eye; OU both eyes  XT exotropia; ET esotropia; PEK punctate epithelial keratitis; PEE punctate epithelial erosions; DES dry eye syndrome; MGD meibomian gland dysfunction; ATs artificial tears; PFAT's preservative free artificial tears; [place] nuclear sclerotic cataract; PSC posterior subcapsular cataract; ERM epi-retinal membrane; PVD posterior vitreous detachment; RD retinal detachment; DM diabetes mellitus; DR diabetic retinopathy; NPDR non-proliferative diabetic retinopathy; PDR proliferative diabetic retinopathy; CSME clinically significant macular edema; DME diabetic macular edema; dbh dot blot hemorrhages; CWS cotton wool spot; POAG primary open angle glaucoma; C/D cup-to-disc ratio; HVF humphrey visual field; GVF goldmann visual field; OCT optical coherence tomography; IOP intraocular pressure; BRVO Branch retinal vein occlusion; CRVO central retinal vein occlusion; CRAO central retinal artery occlusion; BRAO branch retinal artery occlusion; RT retinal tear; SB scleral buckle; PPV pars plana vitrectomy; VH Vitreous hemorrhage; PRP panretinal laser photocoagulation; IVK intravitreal kenalog; VMT vitreomacular traction; MH Macular hole;  NVD neovascularization of the disc; NVE neovascularization elsewhere; AREDS age related eye disease study; ARMD age related macular degeneration; POAG primary open  angle glaucoma; EBMD epithelial/anterior basement membrane dystrophy; ACIOL anterior chamber intraocular lens; IOL  intraocular lens; PCIOL posterior chamber intraocular lens; Phaco/IOL phacoemulsification with intraocular lens placement; [place] photorefractive keratectomy; LASIK laser assisted in situ keratomileusis; HTN hypertension; DM diabetes mellitus; COPD chronic obstructive pulmonary disease

## 2021-10-15 ENCOUNTER — Encounter (INDEPENDENT_AMBULATORY_CARE_PROVIDER_SITE_OTHER): Payer: Self-pay | Admitting: Ophthalmology

## 2021-10-15 ENCOUNTER — Ambulatory Visit (INDEPENDENT_AMBULATORY_CARE_PROVIDER_SITE_OTHER): Payer: PPO | Admitting: Ophthalmology

## 2021-10-15 DIAGNOSIS — H43821 Vitreomacular adhesion, right eye: Secondary | ICD-10-CM

## 2021-10-15 DIAGNOSIS — H35372 Puckering of macula, left eye: Secondary | ICD-10-CM | POA: Diagnosis not present

## 2021-10-15 DIAGNOSIS — H43812 Vitreous degeneration, left eye: Secondary | ICD-10-CM

## 2021-10-15 DIAGNOSIS — H25813 Combined forms of age-related cataract, bilateral: Secondary | ICD-10-CM | POA: Diagnosis not present

## 2021-10-16 DIAGNOSIS — R300 Dysuria: Secondary | ICD-10-CM | POA: Diagnosis not present

## 2022-01-27 IMAGING — MG DIGITAL DIAGNOSTIC BILAT W/ TOMO W/ CAD
8 series · 8 of 24 positions shown · non-contrast
Comparison: Previous exam(s).

CLINICAL DATA: 69-year-old female for 2 year follow-up of LEFT
breast asymmetry/mass and for annual bilateral mammogram.

EXAM:
DIGITAL DIAGNOSTIC BILATERAL MAMMOGRAM WITH TOMOSYNTHESIS AND CAD;
ULTRASOUND LEFT BREAST LIMITED
TECHNIQUE: Bilateral digital diagnostic mammography and breast tomosynthesis
was performed. The images were evaluated with computer-aided
detection.; Targeted ultrasound examination of the left breast was
performed.

[R CC synth-2D]
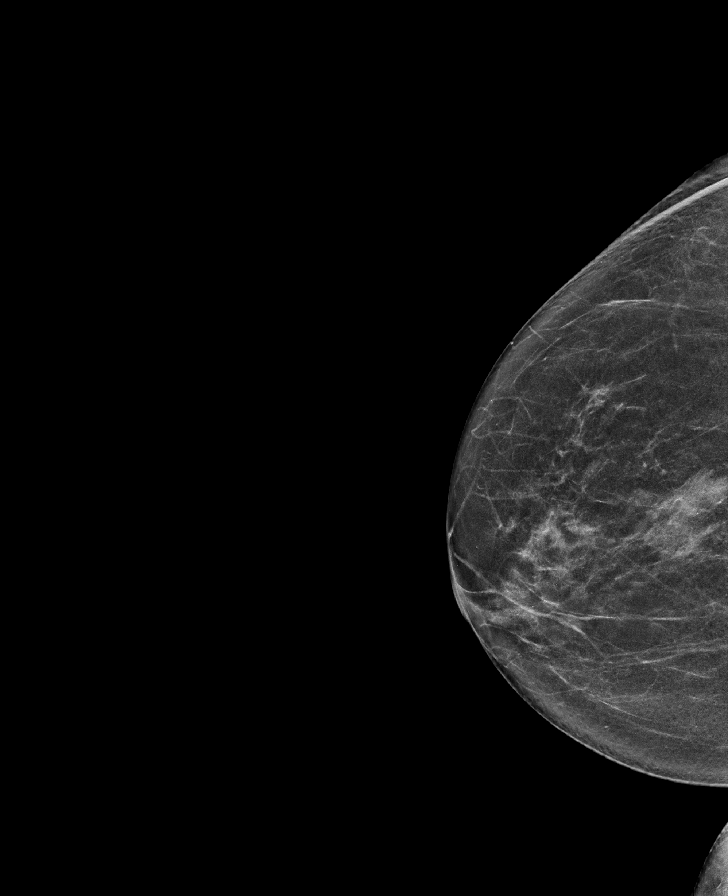

[R MLO synth-2D]
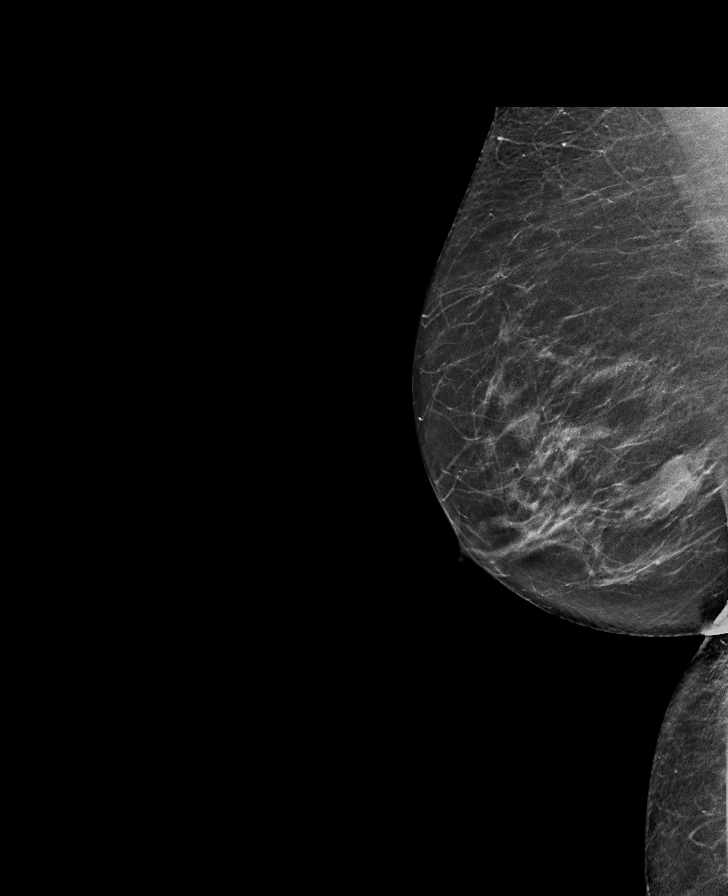

[L MLO synth-2D]
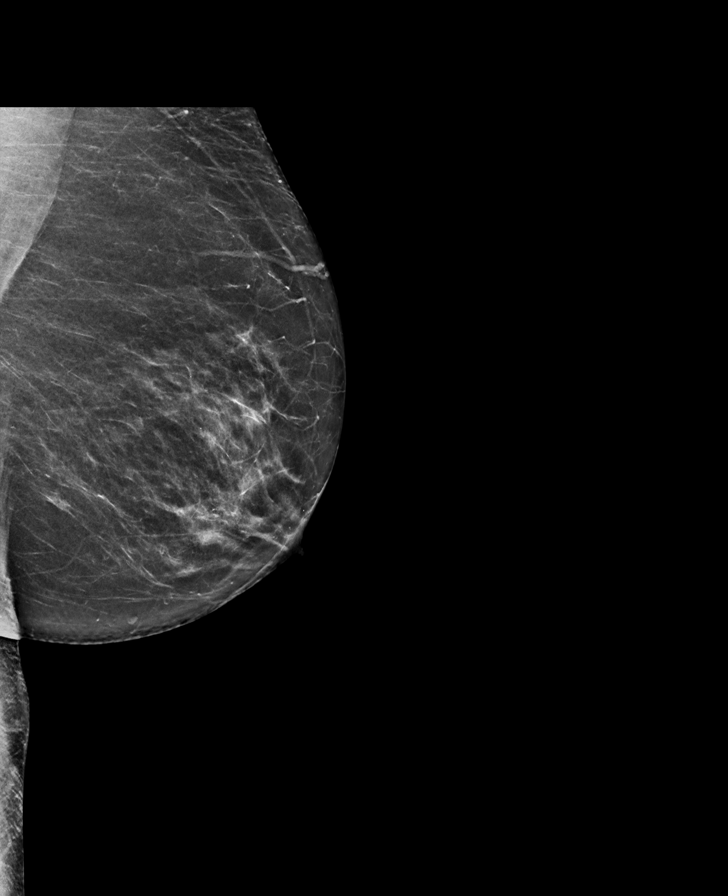

[L CC synth-2D]
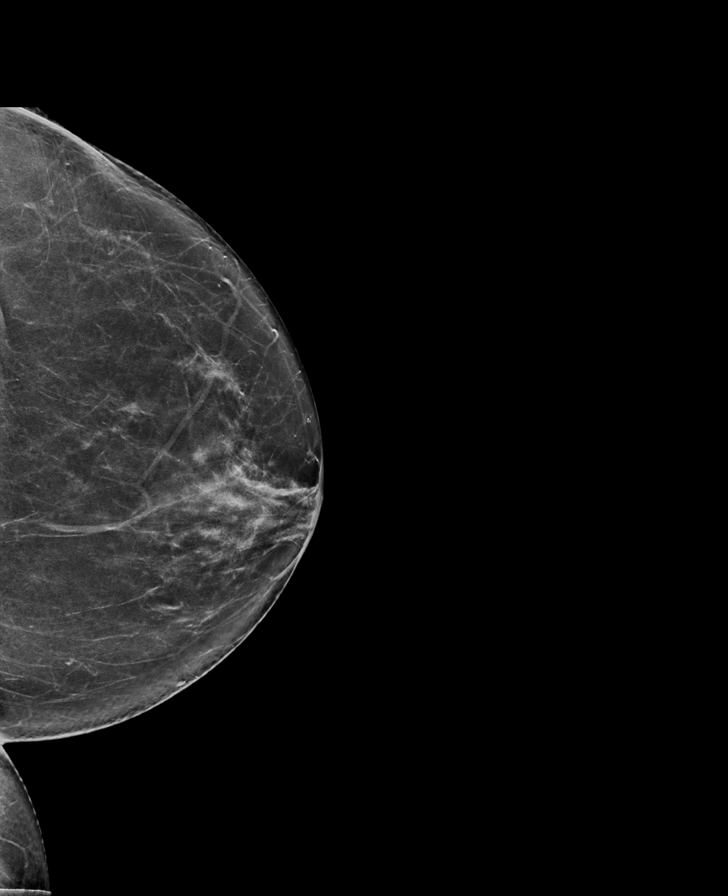

[L MLO tomo · tomo slice 37/72.0]
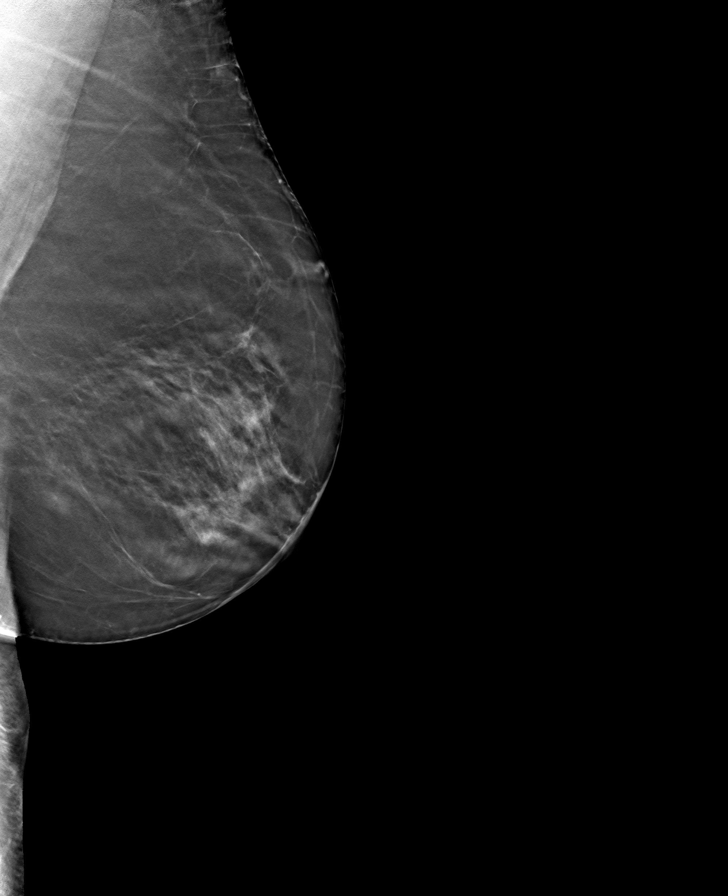

[L CC tomo · tomo slice 37/72.0]
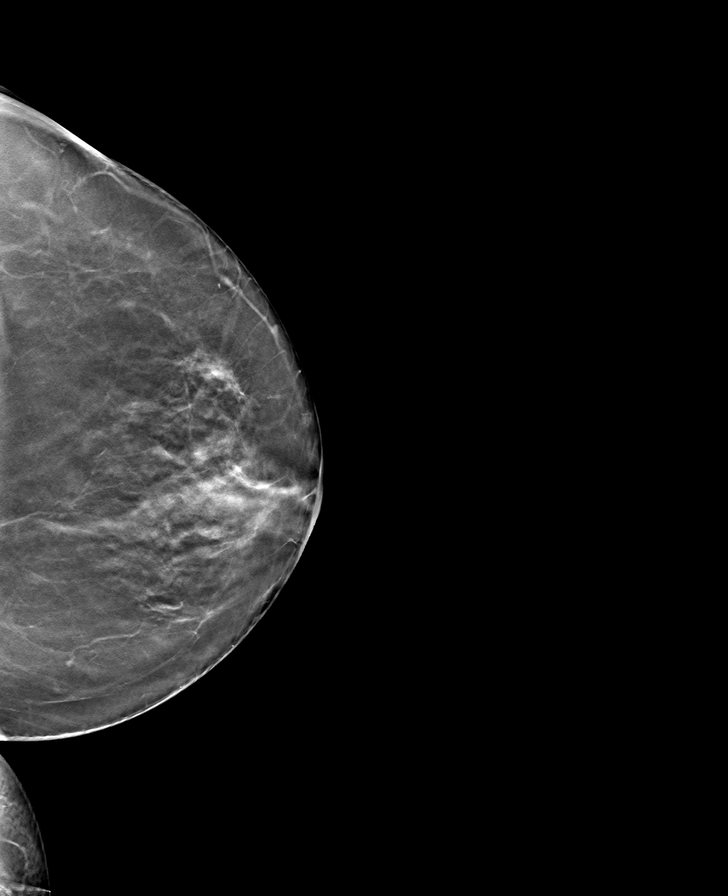

[R CC tomo · tomo slice 34/67.0]
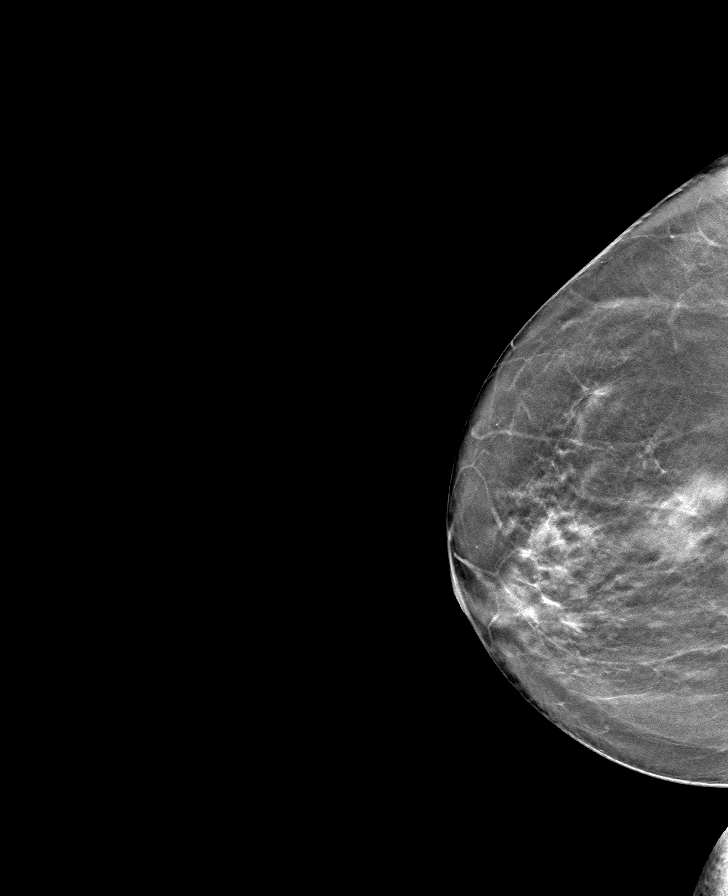

[R MLO tomo · tomo slice 35/68.0]
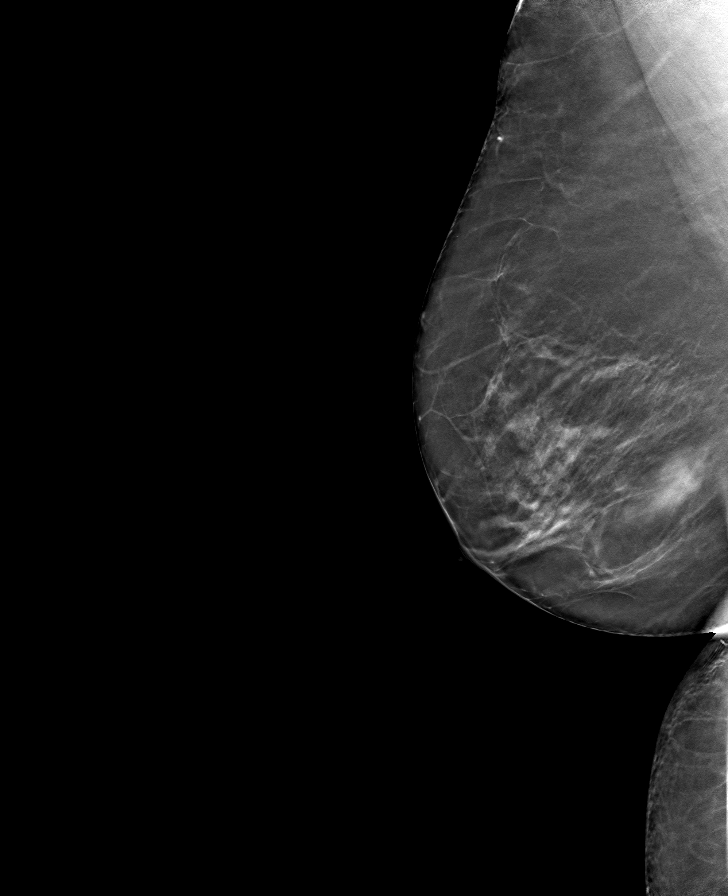

[8 of 24 positions shown; findings below may reference images not displayed]

ACR Breast Density Category b: There are scattered areas of
fibroglandular density.
FINDINGS: 2D/3D full field views of both breasts demonstrate less conspicuous
asymmetry within the posterior LEFT breast when compared to 6898
studies.

No new or suspicious mammographic findings are noted within either
breast.

Targeted ultrasound is performed, showing a 0.5 x 0.2 x 0.4 cm
cystic mass at the 6 o'clock position of the LEFT breast 4 cm from
the nipple, previously measuring 0.7 x 0.3 x 0.5 cm.
IMPRESSION: 1. Decreased size of benign cystic lesion in the LOWER LEFT breast.
No further imaging follow-up recommended.
2. No mammographic evidence of breast malignancy.

RECOMMENDATION:
Bilateral screening mammogram in 1 year.

I have discussed the findings and recommendations with the patient.
If applicable, a reminder letter will be sent to the patient
regarding the next appointment.

BI-RADS CATEGORY  2: Benign.

## 2022-02-12 DIAGNOSIS — L820 Inflamed seborrheic keratosis: Secondary | ICD-10-CM | POA: Diagnosis not present

## 2022-02-12 DIAGNOSIS — L719 Rosacea, unspecified: Secondary | ICD-10-CM | POA: Diagnosis not present

## 2022-04-19 NOTE — Progress Notes (Signed)
[facility] Note  04/22/2022     CHIEF COMPLAINT Patient presents for Retina Follow Up   HISTORY OF PRESENT ILLNESS: Alexis Tate is a 72 y.o. female who presents to the clinic today for:   HPI     Retina Follow Up   Patient presents with  Other.  In right eye.  This started years ago.  Duration of 6 months.  I, the attending physician,  performed the HPI with the patient and updated documentation appropriately.        Comments   Patient feels that the vision is stable. She is not using any eye drops at this time.       Last edited by Bernarda Caffey, MD on 04/24/2022  9:47 PM.     Pt states she feels like her vision is stable, she has not seen Dr. Hulan Saas recently  Referring physician: Mateo Flow, MD [place],  [place] [address]  HISTORICAL INFORMATION:   Selected notes from the MEDICAL RECORD NUMBER Referred by Dr. Hulan Saas for concern of mac hole OS   CURRENT MEDICATIONS: No current outpatient medications on file. (Ophthalmic Drugs)   No current facility-administered medications for this visit. (Ophthalmic Drugs)   Current Outpatient Medications (Other)  Medication Sig   ALPRAZolam (XANAX) 0.5 MG tablet Take 0.5 mg by mouth as needed.   mirabegron ER (MYRBETRIQ) 25 MG TB24 tablet Take 25 mg by mouth daily.   oxyCODONE-acetaminophen (PERCOCET/ROXICET) 5-325 MG per tablet Take 2 tablets by mouth every 6 (six) hours as needed for severe pain. (Patient not taking: Reported on 06/09/2019)   triamcinolone cream (KENALOG) 0.1 % Apply 1 application topically. Twice a week   No current facility-administered medications for this visit. (Other)   REVIEW OF SYSTEMS: ROS   Positive for: Eyes Negative for: Constitutional, Gastrointestinal, Neurological, Skin, Genitourinary, Musculoskeletal, HENT, Endocrine, Cardiovascular, Respiratory, Psychiatric, Allergic/Imm, Heme/Lymph Last edited by Annie Paras, COT on  04/22/2022  9:18 AM.     ALLERGIES Allergies  Allergen Reactions   Amoxicillin    Erythromycin    Penicillins    PAST MEDICAL HISTORY Past Medical History:  Diagnosis Date   Asthma    Past Surgical History:  Procedure Laterality Date   FOOT SURGERY     WRIST SURGERY     FAMILY HISTORY Family History  Problem Relation Age of Onset   Diabetes Mother    Diabetes Maternal Aunt    SOCIAL HISTORY Social History   Tobacco Use   Smoking status: Never   Smokeless tobacco: Never  Vaping Use   Vaping Use: Never used  Substance Use Topics   Alcohol use: No   Drug use: No       OPHTHALMIC EXAM:  Base Eye Exam     Visual Acuity (Snellen - Linear)       Right Left   Dist cc 20/40 +2 20/25 +2   Dist ph cc NI     Correction: Glasses         Tonometry (Tonopen, 9:23 AM)       Right Left   Pressure 13 14         Pupils       Dark Light Shape React APD   Right 3 2 Round Brisk None   Left 3 2 Round Brisk None         Visual Fields       Left Right    Full Full  Extraocular Movement       Right Left    Full, Ortho Full, Ortho         Neuro/Psych     Oriented x3: Yes   Mood/Affect: Normal         Dilation     Both eyes: 1.0% Mydriacyl, 2.5% Phenylephrine @ 9:18 AM           Slit Lamp and Fundus Exam     Slit Lamp Exam       Right Left   Lids/Lashes Dermatochalasis - upper lid Dermatochalasis - upper lid, mild Meibomian gland dysfunction   Conjunctiva/Sclera White and quiet White and quiet   Cornea trace arcus, trace PEE, mild tear film debris trace arcus, trace PEE, mild tear film debris   Anterior Chamber Deep and quiet Deep and quiet, no cell or flare   Iris Round and dilated, focal pigmented spot at 0900 Round and dilated   Lens 2+Nuclear sclerosis, 2-3+ Cortical 2-3+Nuclear sclerosis, 2-3+ Cortical   Anterior Vitreous Vitreous syneresis Vitreous syneresis, Posterior vitreous detachment, Weiss ring, vitreous  condensations         Fundus Exam       Right Left   Disc Pink and sharp, +PPP Pink and sharp, compact   C/D Ratio 0.2 0.1   Macula Flat, Blunted foveal reflex, Retinal pigment epithelial mottling, No heme or edema, mild ERM Flat, Blunted foveal reflex, ERM, +Central cystic changes -- stable, no heme, RPE mottling and clumping   Vessels mild attenuation, mild tortuosity mild attenuation, mild tortuosity   Periphery Attached, mild reticular degeneration, no heme Attached, mild reticular degeneration, no heme, No RT/RD           Refraction     Wearing Rx       Sphere Cylinder Axis Add   Right +0.50 +0.75 180 +2.50   Left [place] +0.75 180 +2.50            IMAGING AND PROCEDURES  Imaging and Procedures for '@TODAY'$ @  OCT, Retina - OU - Both Eyes       Right Eye Quality was good. Central Foveal Thickness: 310. Progression has worsened. Findings include no IRF, no SRF, abnormal foveal contour, vitreomacular adhesion (Mild interval progression of VMT -- increased central thickening).   Left Eye Quality was good. Central Foveal Thickness: 399. Progression has been stable. Findings include no SRF, abnormal foveal contour, epiretinal membrane, intraretinal fluid, macular pucker (Persistent ERM and pucker -- stable, persistent central cystic changes / schisis).   Notes *Images captured and stored on drive  Diagnosis / Impression:  OD: NFP, no IRF/SRF; Mild interval progression of VMT -- increased central thickening OS: Persistent ERM and pucker -- stable; persistent central cystic changes / schisis  Clinical management:  See below  Abbreviations: NFP - Normal foveal profile. CME - cystoid macular edema. PED - pigment epithelial detachment. IRF - intraretinal fluid. SRF - subretinal fluid. EZ - ellipsoid zone. ERM - epiretinal membrane. ORA - outer retinal atrophy. ORT - outer retinal tubulation. SRHM - subretinal hyper-reflective material           ASSESSMENT/PLAN:     ICD-10-CM   1. Vitreomacular adhesion of right eye  H43.821 OCT, Retina - OU - Both Eyes    2. Posterior vitreous detachment of left eye  H43.812     3. Epiretinal membrane (ERM) of left eye  H35.372 OCT, Retina - OU - Both Eyes    4. Combined forms of age-related cataract of both  eyes  H25.813       1,2. VMA OD; PVD OS  - OD: Mild interval progression of VMT -- increased central thickening   - stable PVD OS w/ stable improvement in symptomatic floaters  - Discussed findings and prognosis   - No RT or RD on 360 peripheral exam  - Reviewed s/s of RT/RD  - Strict return precautions for any such RT/RD signs/symptoms  - f/u in 3-4 months for DFE/OCT  3. Epiretinal membrane, OS  - moderate ERM w/ foveoschisis -- no significant change from prior  - no metamorphopsia  - OCT shows Persistent ERM and pucker -- stable, persistent central cystic changes / schisis  - BCVA 20/25 OS from 20/20  - no indication for surgery at this time  - monitor for now  4. Mixed cataract OU   - The symptoms of cataract, surgical options, and treatments and risks were discussed with patient.  - discussed diagnosis and progression  - pt reports significant symptoms when driving   Ophthalmic Meds Ordered this visit:  No orders of the defined types were placed in this encounter.    Return for f/u 3-4 months, VMT OS, DFE, OCT.  There are no Patient Instructions on file for this visit.  This document serves as a record of services personally performed by Gardiner Sleeper, MD, PhD. It was created on their behalf by Roselee Nova, COMT. The creation of this record is the provider's dictation and/or activities during the visit.  Electronically signed by: Roselee Nova, COMT 04/24/22 9:47 PM  This document serves as a record of services personally performed by Gardiner Sleeper, MD, PhD. It was created on their behalf by San Jetty. Owens Shark, OA an ophthalmic technician. The creation of this record is the provider's  dictation and/or activities during the visit.    Electronically signed by: San Jetty. Marguerita Merles 02.12.2024 9:47 PM  Gardiner Sleeper, M.D., Ph.D. Diseases & Surgery of the Retina and Vitreous [facility]  I have reviewed the above documentation for accuracy and completeness, and I agree with the above. Gardiner Sleeper, M.D., Ph.D. 04/24/22 9:49 PM   Abbreviations: M myopia (nearsighted); A astigmatism; H hyperopia (farsighted); P presbyopia; Mrx spectacle prescription;  CTL contact lenses; OD right eye; OS left eye; OU both eyes  XT exotropia; ET esotropia; PEK punctate epithelial keratitis; PEE punctate epithelial erosions; DES dry eye syndrome; MGD meibomian gland dysfunction; ATs artificial tears; PFAT's preservative free artificial tears; [place] nuclear sclerotic cataract; PSC posterior subcapsular cataract; ERM epi-retinal membrane; PVD posterior vitreous detachment; RD retinal detachment; DM diabetes mellitus; DR diabetic retinopathy; NPDR non-proliferative diabetic retinopathy; PDR proliferative diabetic retinopathy; CSME clinically significant macular edema; DME diabetic macular edema; dbh dot blot hemorrhages; CWS cotton wool spot; POAG primary open angle glaucoma; C/D cup-to-disc ratio; HVF humphrey visual field; GVF goldmann visual field; OCT optical coherence tomography; IOP intraocular pressure; BRVO Branch retinal vein occlusion; CRVO central retinal vein occlusion; CRAO central retinal artery occlusion; BRAO branch retinal artery occlusion; RT retinal tear; SB scleral buckle; PPV pars plana vitrectomy; VH Vitreous hemorrhage; PRP panretinal laser photocoagulation; IVK intravitreal kenalog; VMT vitreomacular traction; MH Macular hole;  NVD neovascularization of the disc; NVE neovascularization elsewhere; AREDS age related eye disease study; ARMD age related macular degeneration; POAG primary open angle glaucoma; EBMD epithelial/anterior basement membrane dystrophy; ACIOL  anterior chamber intraocular lens; IOL intraocular lens; PCIOL posterior chamber intraocular lens; Phaco/IOL phacoemulsification with intraocular lens placement; PRK photorefractive keratectomy; LASIK  laser assisted in situ keratomileusis; HTN hypertension; DM diabetes mellitus; COPD chronic obstructive pulmonary disease

## 2022-04-22 ENCOUNTER — Ambulatory Visit (INDEPENDENT_AMBULATORY_CARE_PROVIDER_SITE_OTHER): Payer: PPO | Admitting: Ophthalmology

## 2022-04-22 DIAGNOSIS — H43821 Vitreomacular adhesion, right eye: Secondary | ICD-10-CM

## 2022-04-22 DIAGNOSIS — H43812 Vitreous degeneration, left eye: Secondary | ICD-10-CM | POA: Diagnosis not present

## 2022-04-22 DIAGNOSIS — H35372 Puckering of macula, left eye: Secondary | ICD-10-CM | POA: Diagnosis not present

## 2022-04-22 DIAGNOSIS — H25813 Combined forms of age-related cataract, bilateral: Secondary | ICD-10-CM | POA: Diagnosis not present

## 2022-04-24 ENCOUNTER — Encounter (INDEPENDENT_AMBULATORY_CARE_PROVIDER_SITE_OTHER): Payer: Self-pay | Admitting: Ophthalmology

## 2022-06-17 DIAGNOSIS — M25872 Other specified joint disorders, left ankle and foot: Secondary | ICD-10-CM | POA: Diagnosis not present

## 2022-06-17 DIAGNOSIS — M79672 Pain in left foot: Secondary | ICD-10-CM | POA: Diagnosis not present

## 2022-06-19 DIAGNOSIS — L719 Rosacea, unspecified: Secondary | ICD-10-CM | POA: Diagnosis not present

## 2022-06-19 DIAGNOSIS — L820 Inflamed seborrheic keratosis: Secondary | ICD-10-CM | POA: Diagnosis not present

## 2022-07-30 DIAGNOSIS — H35373 Puckering of macula, bilateral: Secondary | ICD-10-CM | POA: Diagnosis not present

## 2022-07-30 DIAGNOSIS — H25813 Combined forms of age-related cataract, bilateral: Secondary | ICD-10-CM | POA: Diagnosis not present

## 2022-08-07 DIAGNOSIS — Z6833 Body mass index (BMI) 33.0-33.9, adult: Secondary | ICD-10-CM | POA: Diagnosis not present

## 2022-08-07 DIAGNOSIS — J45909 Unspecified asthma, uncomplicated: Secondary | ICD-10-CM | POA: Diagnosis not present

## 2022-08-08 NOTE — Progress Notes (Signed)
[facility] - Clinic Note  08/12/2022     CHIEF COMPLAINT Patient presents for Retina Follow Up   HISTORY OF PRESENT ILLNESS: Alexis Tate is a 72 y.o. female who presents to the clinic today for:   HPI     Retina Follow Up   Patient presents with  Other.  In right eye.  This started 4 months ago.  I, the attending physician,  performed the HPI with the patient and updated documentation appropriately.        Comments   Patient here for 4 months retina follow up for VMT OD. Patient states vision about the same. No eye pain. Saw eye doctor no surgery done.       Last edited by Rennis Chris, MD on 08/12/2022  9:55 PM.    Pt states vision is stable, she saw a cataract surgeon (from [place]) who told her she would not benefit from cataract sx at this time due to her ERM  Referring physician: Lise Auer, MD [address] [place],  [place] [address]  HISTORICAL INFORMATION:   Selected notes from the MEDICAL RECORD NUMBER Referred by Dr. Lawernce Ion for concern of mac hole OS   CURRENT MEDICATIONS: No current outpatient medications on file. (Ophthalmic Drugs)   No current facility-administered medications for this visit. (Ophthalmic Drugs)   Current Outpatient Medications (Other)  Medication Sig   ALPRAZolam (XANAX) 0.5 MG tablet Take 0.5 mg by mouth as needed.   mirabegron ER (MYRBETRIQ) 25 MG TB24 tablet Take 25 mg by mouth daily.   triamcinolone cream (KENALOG) 0.1 % Apply 1 application topically. Twice a week   oxyCODONE-acetaminophen (PERCOCET/ROXICET) 5-325 MG per tablet Take 2 tablets by mouth every 6 (six) hours as needed for severe pain. (Patient not taking: Reported on 06/09/2019)   No current facility-administered medications for this visit. (Other)   REVIEW OF SYSTEMS: ROS   Positive for: Eyes Negative for: Constitutional, Gastrointestinal, Neurological, Skin, Genitourinary, Musculoskeletal, HENT, Endocrine, Cardiovascular,  Respiratory, Psychiatric, Allergic/Imm, Heme/Lymph Last edited by Laddie Aquas, COA on 08/12/2022  9:22 AM.      ALLERGIES Allergies  Allergen Reactions   Amoxicillin    Erythromycin    Penicillins    PAST MEDICAL HISTORY Past Medical History:  Diagnosis Date   Asthma    Past Surgical History:  Procedure Laterality Date   FOOT SURGERY     WRIST SURGERY     FAMILY HISTORY Family History  Problem Relation Age of Onset   Diabetes Mother    Diabetes Maternal Aunt    SOCIAL HISTORY Social History   Tobacco Use   Smoking status: Never   Smokeless tobacco: Never  Vaping Use   Vaping Use: Never used  Substance Use Topics   Alcohol use: No   Drug use: No       OPHTHALMIC EXAM:  Base Eye Exam     Visual Acuity (Snellen - Linear)       Right Left   Dist cc 20/40 -2 20/25 -2   Dist ph cc 20/20 NI    Correction: Glasses         Tonometry (Tonopen, 9:19 AM)       Right Left   Pressure 14 14         Pupils       Dark Light Shape React APD   Right 3 2 Round Brisk None   Left 3 2 Round Brisk None  Visual Fields (Counting fingers)       Left Right    Full Full         Extraocular Movement       Right Left    Full, Ortho Full, Ortho         Neuro/Psych     Oriented x3: Yes   Mood/Affect: Normal         Dilation     Both eyes: 1.0% Mydriacyl, 2.5% Phenylephrine @ 9:19 AM           Slit Lamp and Fundus Exam     Slit Lamp Exam       Right Left   Lids/Lashes Dermatochalasis - upper lid Dermatochalasis - upper lid, mild Meibomian gland dysfunction   Conjunctiva/Sclera White and quiet White and quiet   Cornea trace arcus, trace PEE, mild tear film debris trace arcus, trace PEE, mild tear film debris   Anterior Chamber Deep and quiet Deep and quiet, no cell or flare   Iris Round and dilated, focal pigmented spot at 0900 Round and dilated   Lens 2+Nuclear sclerosis, 2-3+ Cortical 2-3+Nuclear sclerosis, 2-3+ Cortical    Anterior Vitreous mild syneresis Vitreous syneresis, Posterior vitreous detachment, Weiss ring, vitreous condensations         Fundus Exam       Right Left   Disc Pink and sharp, mild PPP Pink and sharp, compact, mild PPP   C/D Ratio 0.2 0.1   Macula Flat, Blunted foveal reflex, Retinal pigment epithelial mottling, No heme or edema, mild ERM Flat, Blunted foveal reflex, ERM, +Central cystic changes -- stable, no heme, RPE mottling and clumping   Vessels attenuated, mild tortuosity attenuated, mild tortuosity   Periphery Attached, mild reticular degeneration, no heme Attached, mild reticular degeneration, no heme, No RT/RD           Refraction     Wearing Rx       Sphere Cylinder Axis Add   Right +0.50 +0.75 180 +2.50   Left [place] +0.75 180 +2.50            IMAGING AND PROCEDURES  Imaging and Procedures for @TODAY @  OCT, Retina - OU - Both Eyes       Right Eye Quality was good. Central Foveal Thickness: 321. Progression has improved. Findings include no IRF, no SRF, abnormal foveal contour, epiretinal membrane, vitreomacular adhesion (Persistent VMT -- decreased central traction, mild ERM).   Left Eye Quality was good. Central Foveal Thickness: 394. Progression has been stable. Findings include no SRF, abnormal foveal contour, epiretinal membrane, intraretinal fluid, macular pucker (Persistent ERM and pucker -- stable, persistent central cystic changes / schisis).   Notes *Images captured and stored on drive  Diagnosis / Impression:  OD: NFP, no IRF/SRF; Persistent VMT -- decreased central traction, mild ERM OS: Persistent ERM and pucker -- stable; persistent central cystic changes / schisis  Clinical management:  See below  Abbreviations: NFP - Normal foveal profile. CME - cystoid macular edema. PED - pigment epithelial detachment. IRF - intraretinal fluid. SRF - subretinal fluid. EZ - ellipsoid zone. ERM - epiretinal membrane. ORA - outer retinal atrophy.  ORT - outer retinal tubulation. SRHM - subretinal hyper-reflective material            ASSESSMENT/PLAN:    ICD-10-CM   1. Vitreomacular adhesion of right eye  H43.821 OCT, Retina - OU - Both Eyes    2. Posterior vitreous detachment of left eye  H43.812  3. Epiretinal membrane (ERM) of left eye  H35.372     4. Combined forms of age-related cataract of both eyes  H25.813      1,2. VMA OD; PVD OS  - OD: Persistent VMT -- decreased central traction (improved) - BCVA OD 20/20 from 20/40   - stable PVD OS w/ stable improvement in symptomatic floaters  - Discussed findings and prognosis   - No RT or RD on 360 peripheral exam  - Reviewed s/s of RT/RD  - Strict return precautions for any such RT/RD signs/symptoms  - f/u in 4 months for DFE/OCT  3. Epiretinal membrane, OS  - moderate ERM w/ foveoschisis -- no significant change from prior  - no metamorphopsia  - OCT shows Persistent ERM and pucker -- stable, persistent central cystic changes / schisis  - BCVA 20/25 OS -- stable  - no indication for surgery at this time  - monitor for now  4. Mixed cataract OU   - The symptoms of cataract, surgical options, and treatments and risks were discussed with patient.  - discussed diagnosis and progression  - patient reports significant blurry vision when driving  - saw cataract surgeon in [place] from [place] - holding on surgery for now   Ophthalmic Meds Ordered this visit:  No orders of the defined types were placed in this encounter.    Return in about 4 months (around 12/12/2022) for f/u VMA OD, DFE, OCT.  There are no Patient Instructions on file for this visit.  This document serves as a record of services personally performed by Karie Chimera, MD, PhD. It was created on their behalf by Annalee Genta, COMT. The creation of this record is the provider's dictation and/or activities during the visit.  Electronically signed by: Annalee Genta, COMT 08/12/22 10:11  PM  This document serves as a record of services personally performed by Karie Chimera, MD, PhD. It was created on their behalf by Glee Arvin. Manson Passey, OA an ophthalmic technician. The creation of this record is the provider's dictation and/or activities during the visit.    Electronically signed by: Glee Arvin. Manson Passey, [place] 06.03.2024 10:11 PM  Karie Chimera, M.D., Ph.D. Diseases & Surgery of the Retina and Vitreous [facility]  I have reviewed the above documentation for accuracy and completeness, and I agree with the above. Karie Chimera, M.D., Ph.D. 08/12/22 10:11 PM   Abbreviations: M myopia (nearsighted); A astigmatism; H hyperopia (farsighted); P presbyopia; Mrx spectacle prescription;  CTL contact lenses; OD right eye; OS left eye; OU both eyes  XT exotropia; ET esotropia; PEK punctate epithelial keratitis; PEE punctate epithelial erosions; DES dry eye syndrome; MGD meibomian gland dysfunction; ATs artificial tears; PFAT's preservative free artificial tears; NSC nuclear sclerotic cataract; PSC posterior subcapsular cataract; ERM epi-retinal membrane; PVD posterior vitreous detachment; RD retinal detachment; DM diabetes mellitus; DR diabetic retinopathy; NPDR non-proliferative diabetic retinopathy; PDR proliferative diabetic retinopathy; CSME clinically significant macular edema; DME diabetic macular edema; dbh dot blot hemorrhages; CWS cotton wool spot; POAG primary open angle glaucoma; C/D cup-to-disc ratio; HVF humphrey visual field; GVF goldmann visual field; OCT optical coherence tomography; IOP intraocular pressure; BRVO Branch retinal vein occlusion; CRVO central retinal vein occlusion; CRAO central retinal artery occlusion; BRAO branch retinal artery occlusion; RT retinal tear; SB scleral buckle; PPV pars plana vitrectomy; VH Vitreous hemorrhage; PRP panretinal laser photocoagulation; IVK intravitreal kenalog; VMT vitreomacular traction; MH Macular hole;  NVD  neovascularization of the disc; NVE neovascularization  elsewhere; AREDS age related eye disease study; ARMD age related macular degeneration; POAG primary open angle glaucoma; EBMD epithelial/anterior basement membrane dystrophy; ACIOL anterior chamber intraocular lens; IOL intraocular lens; PCIOL posterior chamber intraocular lens; Phaco/IOL phacoemulsification with intraocular lens placement; PRK photorefractive keratectomy; LASIK laser assisted in situ keratomileusis; HTN hypertension; DM diabetes mellitus; COPD chronic obstructive pulmonary disease

## 2022-08-12 ENCOUNTER — Ambulatory Visit (INDEPENDENT_AMBULATORY_CARE_PROVIDER_SITE_OTHER): Payer: PPO | Admitting: Ophthalmology

## 2022-08-12 ENCOUNTER — Encounter (INDEPENDENT_AMBULATORY_CARE_PROVIDER_SITE_OTHER): Payer: Self-pay | Admitting: Ophthalmology

## 2022-08-12 DIAGNOSIS — H43812 Vitreous degeneration, left eye: Secondary | ICD-10-CM

## 2022-08-12 DIAGNOSIS — H35372 Puckering of macula, left eye: Secondary | ICD-10-CM | POA: Diagnosis not present

## 2022-08-12 DIAGNOSIS — H25813 Combined forms of age-related cataract, bilateral: Secondary | ICD-10-CM

## 2022-08-12 DIAGNOSIS — H43821 Vitreomacular adhesion, right eye: Secondary | ICD-10-CM

## 2022-09-04 DIAGNOSIS — H3530 Unspecified macular degeneration: Secondary | ICD-10-CM | POA: Diagnosis not present

## 2022-09-04 DIAGNOSIS — H2513 Age-related nuclear cataract, bilateral: Secondary | ICD-10-CM | POA: Diagnosis not present

## 2022-09-10 DIAGNOSIS — Z1231 Encounter for screening mammogram for malignant neoplasm of breast: Secondary | ICD-10-CM | POA: Diagnosis not present

## 2022-09-10 DIAGNOSIS — Z01419 Encounter for gynecological examination (general) (routine) without abnormal findings: Secondary | ICD-10-CM | POA: Diagnosis not present

## 2022-11-28 NOTE — Progress Notes (Signed)
[facility] - Clinic Note  12/10/2022     CHIEF COMPLAINT Patient presents for Retina Follow Up   HISTORY OF PRESENT ILLNESS: Alexis Tate is a 72 y.o. female who presents to the clinic today for:   HPI     Retina Follow Up   Patient presents with  Other.  In right eye.  This started 4 months ago.  Duration of 4 months.  Since onset it is stable.  I, the attending physician,  performed the HPI with the patient and updated documentation appropriately.        Comments   4 month retina follow up VMA OD pt is reporting no vision changes noticed she denies any flashes has noticed some floaters os       Last edited by Rennis Chris, MD on 12/13/2022  2:06 AM.     Pt states she thought her vision was a little better, but she had a hard time reading the eye chart this morning, she states she saw a cataract sx in May at [facility] in [place], but states the dr said she wouldn't do the surgery due to the pts ERM in her left eye, pt states Dr. Lawernce Ion retired so she has to find a new optometrist   Referring physician: Lise Auer, MD [address] [place],  [place] [address]  HISTORICAL INFORMATION:   Selected notes from the MEDICAL RECORD NUMBER Referred by Dr. Lawernce Ion for concern of mac hole OS   CURRENT MEDICATIONS: No current outpatient medications on file. (Ophthalmic Drugs)   No current facility-administered medications for this visit. (Ophthalmic Drugs)   Current Outpatient Medications (Other)  Medication Sig   ALPRAZolam (XANAX) 0.5 MG tablet Take 0.5 mg by mouth as needed.   mirabegron ER (MYRBETRIQ) 25 MG TB24 tablet Take 25 mg by mouth daily.   oxyCODONE-acetaminophen (PERCOCET/ROXICET) 5-325 MG per tablet Take 2 tablets by mouth every 6 (six) hours as needed for severe pain. (Patient not taking: Reported on 06/09/2019)   triamcinolone cream (KENALOG) 0.1 % Apply 1 application topically. Twice a week   No current facility-administered  medications for this visit. (Other)   REVIEW OF SYSTEMS: ROS   Positive for: Eyes Negative for: Constitutional, Gastrointestinal, Neurological, Skin, Genitourinary, Musculoskeletal, HENT, Endocrine, Cardiovascular, Respiratory, Psychiatric, Allergic/Imm, Heme/Lymph Last edited by Etheleen Mayhew, COT on 12/10/2022  9:15 AM.     ALLERGIES Allergies  Allergen Reactions   Amoxicillin    Erythromycin    Penicillins    PAST MEDICAL HISTORY Past Medical History:  Diagnosis Date   Asthma    Past Surgical History:  Procedure Laterality Date   FOOT SURGERY     WRIST SURGERY     FAMILY HISTORY Family History  Problem Relation Age of Onset   Diabetes Mother    Diabetes Maternal Aunt    SOCIAL HISTORY Social History   Tobacco Use   Smoking status: Never   Smokeless tobacco: Never  Vaping Use   Vaping status: Never Used  Substance Use Topics   Alcohol use: No   Drug use: No       OPHTHALMIC EXAM:  Base Eye Exam     Visual Acuity (Snellen - Linear)       Right Left   Dist cc 20/40 -2 20/40 -1   Dist ph cc 20/30 -1 NI    Correction: Glasses         Tonometry (Tonopen, 9:32 AM)  Right Left   Pressure 9 12         Pupils       Pupils Dark Light Shape React APD   Right PERRL 3 2 Round Brisk None   Left PERRL 3 2 Round Brisk None         Visual Fields       Left Right    Full Full         Extraocular Movement       Right Left    Full Full         Neuro/Psych     Oriented x3: Yes   Mood/Affect: Normal         Dilation     Both eyes: 2.5% Phenylephrine @ 9:32 AM           Slit Lamp and Fundus Exam     Slit Lamp Exam       Right Left   Lids/Lashes Dermatochalasis - upper lid Dermatochalasis - upper lid, mild Meibomian gland dysfunction   Conjunctiva/Sclera White and quiet White and quiet   Cornea trace arcus, trace PEE, mild tear film debris, fine endo pigment trace arcus, trace PEE, mild tear film debris    Anterior Chamber Deep and quiet Deep and quiet, no cell or flare   Iris Round and dilated, focal pigmented spot at 0900 Round and dilated   Lens 2+Nuclear sclerosis, 2-3+ Cortical 2-3+Nuclear sclerosis, 2-3+ Cortical   Anterior Vitreous mild syneresis Vitreous syneresis, Posterior vitreous detachment, Weiss ring, vitreous condensations         Fundus Exam       Right Left   Disc Pink and sharp, mild PPP Pink and sharp, compact, mild PPP   C/D Ratio 0.2 0.1   Macula Flat, Blunted foveal reflex, Retinal pigment epithelial mottling, No heme or edema, mild ERM Flat, Blunted foveal reflex, ERM, +central cystic changes -- stable, no heme, RPE mottling and clumping   Vessels attenuated, mild tortuosity attenuated, mild tortuosity   Periphery Attached, mild reticular degeneration, no heme Attached, mild reticular degeneration, no heme, No RT/RD           Refraction     Wearing Rx       Sphere Cylinder Axis Add   Right +1.00 +1.00 015 +2.50   Left [place] +1.00 166 +2.50           IMAGING AND PROCEDURES  Imaging and Procedures for @TODAY @  OCT, Retina - OU - Both Eyes       Right Eye Quality was good. Central Foveal Thickness: 348. Progression has improved. Findings include no IRF, no SRF, abnormal foveal contour, epiretinal membrane, vitreomacular adhesion (Persistent VMT -- mild decrease in central traction, mild ERM).   Left Eye Quality was good. Central Foveal Thickness: 394. Progression has been stable. Findings include no SRF, abnormal foveal contour, epiretinal membrane, intraretinal fluid, macular pucker (Persistent ERM and pucker -- stable, persistent central cystic changes / schisis).   Notes *Images captured and stored on drive  Diagnosis / Impression:  OD: NFP, no IRF/SRF; Persistent VMT -- mild decrease in central traction, mild ERM OS: Persistent ERM and pucker -- stable; persistent central cystic changes / schisis  Clinical management:  See  below  Abbreviations: NFP - Normal foveal profile. CME - cystoid macular edema. PED - pigment epithelial detachment. IRF - intraretinal fluid. SRF - subretinal fluid. EZ - ellipsoid zone. ERM - epiretinal membrane. ORA - outer retinal atrophy. ORT - outer retinal tubulation.  SRHM - subretinal hyper-reflective material           ASSESSMENT/PLAN:    ICD-10-CM   1. Vitreomacular adhesion of right eye  H43.821 OCT, Retina - OU - Both Eyes    2. Posterior vitreous detachment of left eye  H43.812     3. Epiretinal membrane (ERM) of left eye  H35.372     4. Combined forms of age-related cataract of both eyes  H25.813      1,2. VMA OD; PVD OS  - OD: Persistent VMT -- decreased central traction (improved) - BCVA OD 20/30 from 20/20 (?cataract progression)  - stable PVD OS w/ stable improvement in symptomatic floaters  - Discussed findings and prognosis   - No RT or RD on 360 peripheral exam  - Reviewed s/s of RT/RD  - Strict return precautions for any such RT/RD signs/symptoms  - f/u in 6 months for DFE/OCT  3. Epiretinal membrane, OS  - moderate ERM w/ foveoschisis -- no significant change from prior  - no metamorphopsia  - OCT shows Persistent ERM and pucker -- stable, persistent central cystic changes / schisis  - BCVA decreased to 20/40 from 20/25 OS (?cataract progression)  - no indication for surgery at this time  - monitor for now  4. Mixed cataract OU   - The symptoms of cataract, surgical options, and treatments and risks were discussed with patient.  - discussed diagnosis and progression  - patient reports significant blurry vision when driving  - saw Dr. Dionne Bucy in [place] from [place] in May 2024 - holding on surgery for now due to ERM [facility]  - will refer to Dr. Zenaida Niece for second opinion due to decrease in [place] OU   Ophthalmic Meds Ordered this visit:  No orders of the defined types were placed in this encounter.    Return in about 6 months (around 06/10/2023) for  f/u VMA OD / ERM OS, DFE, OCT.  There are no Patient Instructions on file for this visit.  This document serves as a record of services personally performed by Karie Chimera, MD, PhD. It was created on their behalf by Charlette Caffey, COT an ophthalmic technician. The creation of this record is the provider's dictation and/or activities during the visit.    Electronically signed by:  Charlette Caffey, COT  12/13/22 2:08 AM  This document serves as a record of services personally performed by Karie Chimera, MD, PhD. It was created on their behalf by Glee Arvin. Manson Passey, OA an ophthalmic technician. The creation of this record is the provider's dictation and/or activities during the visit.    Electronically signed by: Glee Arvin. Manson Passey, OA 12/13/22 2:08 AM  Karie Chimera, M.D., Ph.D. Diseases & Surgery of the Retina and Vitreous [facility]  I have reviewed the above documentation for accuracy and completeness, and I agree with the above. Karie Chimera, M.D., Ph.D. 12/13/22 2:10 AM  Abbreviations: M myopia (nearsighted); A astigmatism; H hyperopia (farsighted); P presbyopia; Mrx spectacle prescription;  CTL contact lenses; OD right eye; OS left eye; OU both eyes  XT exotropia; ET esotropia; PEK punctate epithelial keratitis; PEE punctate epithelial erosions; DES dry eye syndrome; MGD meibomian gland dysfunction; ATs artificial tears; PFAT's preservative free artificial tears; NSC nuclear sclerotic cataract; PSC posterior subcapsular cataract; ERM epi-retinal membrane; PVD posterior vitreous detachment; RD retinal detachment; DM diabetes mellitus; DR diabetic retinopathy; NPDR non-proliferative diabetic retinopathy; PDR proliferative diabetic retinopathy; CSME clinically significant  macular edema; DME diabetic macular edema; dbh dot blot hemorrhages; CWS cotton wool spot; POAG primary open angle glaucoma; C/D cup-to-disc ratio; HVF humphrey visual field; GVF goldmann  visual field; OCT optical coherence tomography; IOP intraocular pressure; BRVO Branch retinal vein occlusion; CRVO central retinal vein occlusion; CRAO central retinal artery occlusion; BRAO branch retinal artery occlusion; RT retinal tear; SB scleral buckle; PPV pars plana vitrectomy; VH Vitreous hemorrhage; PRP panretinal laser photocoagulation; IVK intravitreal kenalog; VMT vitreomacular traction; MH Macular hole;  NVD neovascularization of the disc; NVE neovascularization elsewhere; AREDS age related eye disease study; ARMD age related macular degeneration; POAG primary open angle glaucoma; EBMD epithelial/anterior basement membrane dystrophy; ACIOL anterior chamber intraocular lens; IOL intraocular lens; PCIOL posterior chamber intraocular lens; Phaco/IOL phacoemulsification with intraocular lens placement; PRK photorefractive keratectomy; LASIK laser assisted in situ keratomileusis; HTN hypertension; DM diabetes mellitus; COPD chronic obstructive pulmonary disease

## 2022-12-10 ENCOUNTER — Encounter (INDEPENDENT_AMBULATORY_CARE_PROVIDER_SITE_OTHER): Payer: Self-pay | Admitting: Ophthalmology

## 2022-12-10 ENCOUNTER — Ambulatory Visit (INDEPENDENT_AMBULATORY_CARE_PROVIDER_SITE_OTHER): Payer: PPO | Admitting: Ophthalmology

## 2022-12-10 DIAGNOSIS — H43821 Vitreomacular adhesion, right eye: Secondary | ICD-10-CM

## 2022-12-10 DIAGNOSIS — H35372 Puckering of macula, left eye: Secondary | ICD-10-CM

## 2022-12-10 DIAGNOSIS — H43812 Vitreous degeneration, left eye: Secondary | ICD-10-CM | POA: Diagnosis not present

## 2022-12-10 DIAGNOSIS — H25813 Combined forms of age-related cataract, bilateral: Secondary | ICD-10-CM | POA: Diagnosis not present

## 2022-12-13 ENCOUNTER — Encounter (INDEPENDENT_AMBULATORY_CARE_PROVIDER_SITE_OTHER): Payer: Self-pay | Admitting: Ophthalmology

## 2023-02-12 DIAGNOSIS — H524 Presbyopia: Secondary | ICD-10-CM | POA: Diagnosis not present

## 2023-02-12 DIAGNOSIS — H35373 Puckering of macula, bilateral: Secondary | ICD-10-CM | POA: Diagnosis not present

## 2023-02-12 DIAGNOSIS — H52213 Irregular astigmatism, bilateral: Secondary | ICD-10-CM | POA: Diagnosis not present

## 2023-02-12 DIAGNOSIS — H25813 Combined forms of age-related cataract, bilateral: Secondary | ICD-10-CM | POA: Diagnosis not present

## 2023-03-11 DIAGNOSIS — Z1212 Encounter for screening for malignant neoplasm of rectum: Secondary | ICD-10-CM | POA: Diagnosis not present

## 2023-03-11 DIAGNOSIS — Z6833 Body mass index (BMI) 33.0-33.9, adult: Secondary | ICD-10-CM | POA: Diagnosis not present

## 2023-03-11 DIAGNOSIS — J329 Chronic sinusitis, unspecified: Secondary | ICD-10-CM | POA: Diagnosis not present

## 2023-03-11 DIAGNOSIS — Z8701 Personal history of pneumonia (recurrent): Secondary | ICD-10-CM | POA: Diagnosis not present

## 2023-03-11 DIAGNOSIS — M7541 Impingement syndrome of right shoulder: Secondary | ICD-10-CM | POA: Diagnosis not present

## 2023-03-11 DIAGNOSIS — J40 Bronchitis, not specified as acute or chronic: Secondary | ICD-10-CM | POA: Diagnosis not present

## 2023-06-10 ENCOUNTER — Encounter (INDEPENDENT_AMBULATORY_CARE_PROVIDER_SITE_OTHER): Payer: PPO | Admitting: Ophthalmology

## 2023-06-10 DIAGNOSIS — H43812 Vitreous degeneration, left eye: Secondary | ICD-10-CM

## 2023-06-10 DIAGNOSIS — H25813 Combined forms of age-related cataract, bilateral: Secondary | ICD-10-CM

## 2023-06-10 DIAGNOSIS — H43821 Vitreomacular adhesion, right eye: Secondary | ICD-10-CM

## 2023-06-10 DIAGNOSIS — H35372 Puckering of macula, left eye: Secondary | ICD-10-CM

## 2023-06-23 NOTE — Progress Notes (Addendum)
 [facility] - Clinic Note  06/25/2023     CHIEF COMPLAINT Patient presents for Retina Follow Up   HISTORY OF PRESENT ILLNESS: Alexis Tate is a 73 y.o. female who presents to the clinic today for:   HPI     Retina Follow Up   Patient presents with  Other.  In right eye.  This started 4 years ago.  Duration of 6 months.  Since onset it is stable.  I, the attending physician,  performed the HPI with the patient and updated documentation appropriately.        Comments   6 month retina follow up VMA OD/PVD OS. Pt states she has lots of pollen in her eyes today because she was mowing the lawn but rinsed them with B&L saline. Pt has the same floater in the left eye. Pt denies any flashes. Pt states her left eye was aching for 2 days about 2 weeks ago but its been fine. Pt denies using any ats. Pt was told by Dr. Carloyn Chi she was not ready for cataract surgery.      Last edited by Ronelle Coffee, MD on 06/27/2023  3:18 PM.    Pt saw Dr. Carloyn Chi in December and got a new glasses Rx, but she has not had time to get it filled yet  Referring physician: Beecher Bower, MD [address] [place],  [place] [address]  HISTORICAL INFORMATION:   Selected notes from the MEDICAL RECORD NUMBER Referred by Dr. Bambi Bonine for concern of mac hole OS   CURRENT MEDICATIONS: No current outpatient medications on file. (Ophthalmic Drugs)   No current facility-administered medications for this visit. (Ophthalmic Drugs)   Current Outpatient Medications (Other)  Medication Sig   ALPRAZolam (XANAX) 0.5 MG tablet Take 0.5 mg by mouth as needed.   mirabegron ER (MYRBETRIQ) 25 MG TB24 tablet Take 25 mg by mouth daily.   oxyCODONE -acetaminophen  (PERCOCET/ROXICET) 5-325 MG per tablet Take 2 tablets by mouth every 6 (six) hours as needed for severe pain.   triamcinolone  cream (KENALOG ) 0.1 % Apply 1 application topically. Twice a week   No current facility-administered medications for this  visit. (Other)   REVIEW OF SYSTEMS: ROS   Positive for: Eyes Negative for: Constitutional, Gastrointestinal, Neurological, Skin, Genitourinary, Musculoskeletal, HENT, Endocrine, Cardiovascular, Respiratory, Psychiatric, Allergic/Imm, Heme/Lymph Last edited by Carrington Clack, COT on 06/25/2023  1:13 PM.      ALLERGIES Allergies  Allergen Reactions   Amoxicillin    Erythromycin    Penicillins    PAST MEDICAL HISTORY Past Medical History:  Diagnosis Date   Asthma    Past Surgical History:  Procedure Laterality Date   FOOT SURGERY     WRIST SURGERY     FAMILY HISTORY Family History  Problem Relation Age of Onset   Diabetes Mother    Diabetes Maternal Aunt    SOCIAL HISTORY Social History   Tobacco Use   Smoking status: Never   Smokeless tobacco: Never  Vaping Use   Vaping status: Never Used  Substance Use Topics   Alcohol use: No   Drug use: No       OPHTHALMIC EXAM:  Base Eye Exam     Visual Acuity (Snellen - Linear)       Right Left   Dist cc 20/30 +1 20/25 -2   Dist ph cc 20/20 -1 NI    Correction: Glasses         Tonometry (Tonopen, 1:28 PM)  Right Left   Pressure 16 17         Pupils       Dark Light Shape React APD   Right 3 2 Round Brisk None   Left 3 2 Round Brisk None         Visual Fields       Left Right    Full Full         Extraocular Movement       Right Left    Full, Ortho Full, Ortho         Neuro/Psych     Oriented x3: Yes   Mood/Affect: Normal         Dilation     Both eyes: 1.0% Mydriacyl, 2.5% Phenylephrine @ 1:28 PM           Slit Lamp and Fundus Exam     Slit Lamp Exam       Right Left   Lids/Lashes Dermatochalasis - upper lid Dermatochalasis - upper lid, mild Meibomian gland dysfunction   Conjunctiva/Sclera White and quiet White and quiet   Cornea trace arcus, trace PEE, mild tear film debris, fine endo pigment trace arcus, trace PEE, mild tear film debris   Anterior  Chamber Deep and quiet Deep and quiet, no cell or flare   Iris Round and dilated, focal pigmented spot at 0900 Round and dilated   Lens 2+Nuclear sclerosis, 2-3+ Cortical 2-3+Nuclear sclerosis, 2-3+ Cortical   Anterior Vitreous mild syneresis Vitreous syneresis, Posterior vitreous detachment, Weiss ring, vitreous condensations         Fundus Exam       Right Left   Disc Pink and sharp, mild PPP Pink and sharp, compact, mild PPP   C/D Ratio 0.2 0.1   Macula Flat, Blunted foveal reflex, Retinal pigment epithelial mottling, No heme or edema, mild ERM Flat, Blunted foveal reflex, ERM, +central cystic changes / schisis -- stable, no heme, RPE mottling and clumping   Vessels attenuated, mild tortuosity attenuated, mild tortuosity   Periphery Attached, mild reticular degeneration, focal dot heme at 1200 equator Attached, mild reticular degeneration, no heme, No RT/RD           Refraction     Wearing Rx       Sphere Cylinder Axis Add   Right +1.00 +1.00 015 +2.50   Left [place] +1.00 166 +2.50           IMAGING AND PROCEDURES  Imaging and Procedures for @TODAY @  OCT, Retina - OU - Both Eyes       Right Eye Quality was good. Central Foveal Thickness: 359. Progression has been stable. Findings include no IRF, no SRF, abnormal foveal contour, epiretinal membrane, vitreomacular adhesion (Persistent VMT, mild ERM).   Left Eye Quality was good. Central Foveal Thickness: 391. Progression has been stable. Findings include no SRF, abnormal foveal contour, epiretinal membrane, intraretinal fluid, macular pucker (Persistent ERM and pucker -- stable, persistent central cystic changes / schisis).   Notes *Images captured and stored on drive  Diagnosis / Impression:  OD: Persistent VMT, mild ERM; no IRF/SRF OS: Persistent ERM and pucker -- stable; persistent central cystic changes / schisis  Clinical management:  See below  Abbreviations: NFP - Normal foveal profile. CME - cystoid  macular edema. PED - pigment epithelial detachment. IRF - intraretinal fluid. SRF - subretinal fluid. EZ - ellipsoid zone. ERM - epiretinal membrane. ORA - outer retinal atrophy. ORT - outer retinal tubulation. SRHM - subretinal hyper-reflective material  ASSESSMENT/PLAN:    ICD-10-CM   1. Vitreomacular adhesion of right eye  H43.821 OCT, Retina - OU - Both Eyes    2. Posterior vitreous detachment of left eye  H43.812     3. Epiretinal membrane (ERM) of left eye  H35.372 OCT, Retina - OU - Both Eyes    4. Combined forms of age-related cataract of both eyes  H25.813      1,2. VMA OD; PVD OS  - OD: Persistent VMT - BCVA OD improved to 20/20 from 20/30  - stable PVD OS w/ stable improvement in symptomatic floaters  - Discussed findings and prognosis   - No RT or RD on 360 peripheral exam  - Reviewed s/s of RT/RD  - Strict return precautions for any such RT/RD signs/symptoms  - f/u in 6-9 months for DFE/OCT  3. Epiretinal membrane, OS  - moderate ERM w/ foveoschisis -- no significant change from prior  - no metamorphopsia  - OCT shows Persistent ERM and pucker -- stable, persistent central cystic changes / schisis  - BCVA improved to 20/25 from 20/40 OS   - no indication for surgery at this time  - monitor for now  4. Mixed cataract OU   - The symptoms of cataract, surgical options, and treatments and risks were discussed with patient.  - discussed diagnosis and progression  - patient reports significant blurry vision when driving  - under the expert management of Dr. Carloyn Chi  - deferring surgery for now -- pt happy with new glasses   Ophthalmic Meds Ordered this visit:  No orders of the defined types were placed in this encounter.    Return for f/u 6-9 months, VMA OD, DFE, OCT.  There are no Patient Instructions on file for this visit.  This document serves as a record of services personally performed by Jeanice Millard, MD, PhD. It was created on their behalf  by Morley Arabia. Bevin Bucks, OA an ophthalmic technician. The creation of this record is the provider's dictation and/or activities during the visit.    Electronically signed by: Morley Arabia. Bevin Bucks, OA 06/27/23 3:25 PM  Jeanice Millard, M.D., Ph.D. Diseases & Surgery of the Retina and Vitreous [facility]  I have reviewed the above documentation for accuracy and completeness, and I agree with the above. Jeanice Millard, M.D., Ph.D. 06/27/23 3:28 PM   Abbreviations: M myopia (nearsighted); A astigmatism; H hyperopia (farsighted); P presbyopia; Mrx spectacle prescription;  CTL contact lenses; OD right eye; OS left eye; OU both eyes  XT exotropia; ET esotropia; PEK punctate epithelial keratitis; PEE punctate epithelial erosions; DES dry eye syndrome; MGD meibomian gland dysfunction; ATs artificial tears; PFAT's preservative free artificial tears; NSC nuclear sclerotic cataract; PSC posterior subcapsular cataract; ERM epi-retinal membrane; PVD posterior vitreous detachment; RD retinal detachment; DM diabetes mellitus; DR diabetic retinopathy; NPDR non-proliferative diabetic retinopathy; PDR proliferative diabetic retinopathy; CSME clinically significant macular edema; DME diabetic macular edema; dbh dot blot hemorrhages; CWS cotton wool spot; POAG primary open angle glaucoma; C/D cup-to-disc ratio; HVF humphrey visual field; GVF goldmann visual field; OCT optical coherence tomography; IOP intraocular pressure; BRVO Branch retinal vein occlusion; CRVO central retinal vein occlusion; CRAO central retinal artery occlusion; BRAO branch retinal artery occlusion; RT retinal tear; SB scleral buckle; PPV pars plana vitrectomy; VH Vitreous hemorrhage; PRP panretinal laser photocoagulation; IVK intravitreal kenalog ; VMT vitreomacular traction; MH Macular hole;  NVD neovascularization of the disc; NVE neovascularization elsewhere; AREDS age related eye disease study; ARMD  age related macular degeneration;  POAG primary open angle glaucoma; EBMD epithelial/anterior basement membrane dystrophy; ACIOL anterior chamber intraocular lens; IOL intraocular lens; PCIOL posterior chamber intraocular lens; Phaco/IOL phacoemulsification with intraocular lens placement; PRK photorefractive keratectomy; LASIK laser assisted in situ keratomileusis; HTN hypertension; DM diabetes mellitus; COPD chronic obstructive pulmonary disease

## 2023-06-25 ENCOUNTER — Encounter (INDEPENDENT_AMBULATORY_CARE_PROVIDER_SITE_OTHER): Payer: Self-pay | Admitting: Ophthalmology

## 2023-06-25 ENCOUNTER — Ambulatory Visit (INDEPENDENT_AMBULATORY_CARE_PROVIDER_SITE_OTHER): Admitting: Ophthalmology

## 2023-06-25 DIAGNOSIS — H43812 Vitreous degeneration, left eye: Secondary | ICD-10-CM

## 2023-06-25 DIAGNOSIS — H35372 Puckering of macula, left eye: Secondary | ICD-10-CM | POA: Diagnosis not present

## 2023-06-25 DIAGNOSIS — H43821 Vitreomacular adhesion, right eye: Secondary | ICD-10-CM

## 2023-06-25 DIAGNOSIS — H25813 Combined forms of age-related cataract, bilateral: Secondary | ICD-10-CM

## 2023-06-27 ENCOUNTER — Encounter (INDEPENDENT_AMBULATORY_CARE_PROVIDER_SITE_OTHER): Payer: Self-pay | Admitting: Ophthalmology

## 2023-09-19 DIAGNOSIS — R0981 Nasal congestion: Secondary | ICD-10-CM | POA: Diagnosis not present

## 2023-09-19 DIAGNOSIS — J0100 Acute maxillary sinusitis, unspecified: Secondary | ICD-10-CM | POA: Diagnosis not present

## 2023-09-22 DIAGNOSIS — R5383 Other fatigue: Secondary | ICD-10-CM | POA: Diagnosis not present

## 2023-09-22 DIAGNOSIS — R5381 Other malaise: Secondary | ICD-10-CM | POA: Diagnosis not present

## 2023-09-22 DIAGNOSIS — J45901 Unspecified asthma with (acute) exacerbation: Secondary | ICD-10-CM | POA: Diagnosis not present

## 2023-11-04 DIAGNOSIS — Z1231 Encounter for screening mammogram for malignant neoplasm of breast: Secondary | ICD-10-CM | POA: Diagnosis not present

## 2023-11-04 DIAGNOSIS — Z01419 Encounter for gynecological examination (general) (routine) without abnormal findings: Secondary | ICD-10-CM | POA: Diagnosis not present

## 2023-12-29 NOTE — Progress Notes (Signed)
 [facility] - Clinic Note  12/31/2023     CHIEF COMPLAINT Patient presents for Retina Follow Up   HISTORY OF PRESENT ILLNESS: Alexis Tate is a 73 y.o. female who presents to the clinic today for:   HPI     Retina Follow Up   Patient presents with  Other.  In right eye.  Severity is moderate.  Duration of 3 weeks.  Since onset it is gradually worsening.  I, the attending physician,  performed the HPI with the patient and updated documentation appropriately.        Comments   3 week Retina eval. Patient states vision is down in the right eye times 3 weeks. Patient states things are distorted with the right eye      Last edited by Valdemar Rogue, MD on 01/07/2024  9:29 PM.     Patient feels the vision is getting worse in the right eye.   Referring physician: Fernand Tracey LABOR, MD [address] [place],  [place] [address]  HISTORICAL INFORMATION:   Selected notes from the MEDICAL RECORD NUMBER Referred by Dr. Laurice for concern of mac hole OS   CURRENT MEDICATIONS: No current outpatient medications on file. (Ophthalmic Drugs)   No current facility-administered medications for this visit. (Ophthalmic Drugs)   Current Outpatient Medications (Other)  Medication Sig   ALPRAZolam (XANAX) 0.5 MG tablet Take 0.5 mg by mouth as needed. (Patient not taking: Reported on 12/31/2023)   mirabegron ER (MYRBETRIQ) 25 MG TB24 tablet Take 25 mg by mouth daily. (Patient not taking: Reported on 12/31/2023)   oxyCODONE -acetaminophen  (PERCOCET/ROXICET) 5-325 MG per tablet Take 2 tablets by mouth every 6 (six) hours as needed for severe pain. (Patient not taking: Reported on 12/31/2023)   triamcinolone  cream (KENALOG ) 0.1 % Apply 1 application topically. Twice a week   No current facility-administered medications for this visit. (Other)   REVIEW OF SYSTEMS: ROS   Positive for: Eyes Negative for: Constitutional, Gastrointestinal, Neurological, Skin,  Genitourinary, Musculoskeletal, HENT, Endocrine, Cardiovascular, Respiratory, Psychiatric, Allergic/Imm, Heme/Lymph Last edited by German Olam BRAVO, COT on 12/31/2023  8:34 AM.       ALLERGIES Allergies  Allergen Reactions   Amoxicillin    Erythromycin    Penicillins    PAST MEDICAL HISTORY Past Medical History:  Diagnosis Date   Asthma    Past Surgical History:  Procedure Laterality Date   FOOT SURGERY     WRIST SURGERY     FAMILY HISTORY Family History  Problem Relation Age of Onset   Diabetes Mother    Diabetes Maternal Aunt    SOCIAL HISTORY Social History   Tobacco Use   Smoking status: Never   Smokeless tobacco: Never  Vaping Use   Vaping status: Never Used  Substance Use Topics   Alcohol use: No   Drug use: No       OPHTHALMIC EXAM:  Base Eye Exam     Visual Acuity (Snellen - Linear)       Right Left   Dist cc 20/50 -3 20/30 +1   Dist ph cc 20/NI 20/NI    Correction: Glasses         Tonometry (Tonopen, 8:46 AM)       Right Left   Pressure 9 10         Pupils       Dark Light Shape React APD   Right 3 2 Round Brisk None   Left 3 2 Round Brisk None  Visual Fields (Counting fingers)       Left Right    Full Full         Extraocular Movement       Right Left    Full, Ortho Full, Ortho         Neuro/Psych     Oriented x3: Yes   Mood/Affect: Normal         Dilation     Both eyes: 1.0% Mydriacyl, 2.5% Phenylephrine @ 8:46 AM           Slit Lamp and Fundus Exam     Slit Lamp Exam       Right Left   Lids/Lashes Dermatochalasis - upper lid Dermatochalasis - upper lid, mild Meibomian gland dysfunction   Conjunctiva/Sclera White and quiet nasal Pigmentation   Cornea trace arcus, trace PEE, mild tear film debris trace arcus, trace PEE, mild tear film debris   Anterior Chamber Deep, narrow temporal angle Deep and quiet, no cell or flare   Iris Round and dilated, focal pigmented spot at 0900 Round and  dilated   Lens 2-3+Nuclear sclerosis, 2-3+ Cortical 2-3+Nuclear sclerosis, 2-3+ Cortical   Anterior Vitreous mild syneresis Vitreous syneresis, Posterior vitreous detachment, Weiss ring, vitreous condensations         Fundus Exam       Right Left   Disc Pink and sharp, mild PPP Pink and sharp, compact, mild PPP   C/D Ratio 0.2 0.1   Macula Flat, Blunted foveal reflex, Retinal pigment epithelial mottling, No heme or edema, mild ERM, new central cystic changes Flat, Blunted foveal reflex, ERM, +central cystic changes / schisis -- improved, no heme, RPE mottling and clumping   Vessels attenuated, mild tortuosity attenuated, mild tortuosity   Periphery Attached, mild reticular degeneration, focal dot heme at 1200 equator- resolved Attached, mild reticular degeneration, no heme, No RT/RD           Refraction     Wearing Rx       Sphere Cylinder Axis Add   Right +1.00 +1.00 015 +2.50   Left [place] +1.00 166 +2.50         Manifest Refraction (Auto)       Sphere Cylinder Axis Dist VA   Right +1.75 +1.00 012 20/40-1   Left +0.50 +1.00 177 20/25-2           IMAGING AND PROCEDURES  Imaging and Procedures for @TODAY @  OCT, Retina - OU - Both Eyes       Right Eye Quality was good. Central Foveal Thickness: 408. Progression has worsened. Findings include no IRF, no SRF, abnormal foveal contour, epiretinal membrane, vitreomacular adhesion (Interval progression of VMT w/ central cystic changes and trace SRF, mild ERM).   Left Eye Quality was good. Central Foveal Thickness: 372. Progression has been stable. Findings include no SRF, abnormal foveal contour, epiretinal membrane, intraretinal fluid, macular pucker (Persistent ERM and pucker -- stable, persistent central cystic changes / schisis- slightly improved, persistent vit opacities).   Notes *Images captured and stored on drive  Diagnosis / Impression:  OD: Interval progression of VMT w/  central cystic changes and trace  SRF, mild ERM OS: Persistent ERM and pucker -- stable, persistent central cystic changes / schisis- slightly improved, persistent vit opacities  Clinical management:  See below  Abbreviations: NFP - Normal foveal profile. CME - cystoid macular edema. PED - pigment epithelial detachment. IRF - intraretinal fluid. SRF - subretinal fluid. EZ - ellipsoid zone. ERM - epiretinal membrane.  ORA - outer retinal atrophy. ORT - outer retinal tubulation. SRHM - subretinal hyper-reflective material            ASSESSMENT/PLAN:    ICD-10-CM   1. Vitreomacular adhesion of right eye  H43.821 OCT, Retina - OU - Both Eyes    2. Posterior vitreous detachment of left eye  H43.812     3. Epiretinal membrane (ERM) of left eye  H35.372     4. Combined forms of age-related cataract of both eyes  H25.813     5. Dry eyes, bilateral  H04.123      1,2. VMA OD; PVD OS - OCT shows OD: Interval progression of VMT w/ central cystic changes and trace SRF, mild ERM - BCVA OD 20/50 from 20/20, 20/30 OS stable  - stable PVD OS w/ stable improvement in symptomatic floaters  - Discussed findings and prognosis   - No RT or RD on 360 peripheral exam  - Reviewed s/s of RT/RD  - Strict return precautions for any such RT/RD signs/symptoms  - f/u in 4-6 weeks for DFE/OCT  3. Epiretinal membrane, OS  - moderate ERM w/ foveoschisis -- no significant change from prior  - no metamorphopsia - OCT shows Persistent ERM and pucker -- stable, persistent central cystic changes / schisis- slightly improved, persistent vit opacities  - BCVA 20/30 stable  - no indication for surgery at this time  - monitor for now  4. Mixed cataract OU  - The symptoms of cataract, surgical options, and treatments and risks were discussed with patient.  - discussed diagnosis and progression  - patient reports significant blurry vision when driving  - under the expert management of Dr. Fleeta  - refer back to Dr. Fleeta for cataract eval  5. Dry  eyes OU - recommend artificial tears and lubricating ointment as needed   Ophthalmic Meds Ordered this visit:  No orders of the defined types were placed in this encounter.    Return in about 6 weeks (around 02/11/2024) for f/u VMA, DFE, OCT.  There are no Patient Instructions on file for this visit.  This document serves as a record of services personally performed by Redell JUDITHANN Hans, MD, PhD. It was created on their behalf by Almetta Pesa, an ophthalmic technician. The creation of this record is the provider's dictation and/or activities during the visit.    Electronically signed by: Almetta Pesa, OA, 01/07/24  9:31 PM  This document serves as a record of services personally performed by Redell JUDITHANN Hans, MD, PhD. It was created on their behalf by Wanda GEANNIE Keens, COT an ophthalmic technician. The creation of this record is the provider's dictation and/or activities during the visit.    Electronically signed by:  Wanda GEANNIE Keens, COT  01/07/24 9:31 PM  Redell JUDITHANN Hans, M.D., Ph.D. Diseases & Surgery of the Retina and Vitreous [facility]  I have reviewed the above documentation for accuracy and completeness, and I agree with the above. Redell JUDITHANN Hans, M.D., Ph.D. 01/07/24 9:31 PM   Abbreviations: M myopia (nearsighted); A astigmatism; H hyperopia (farsighted); P presbyopia; Mrx spectacle prescription;  CTL contact lenses; OD right eye; OS left eye; OU both eyes  XT exotropia; ET esotropia; PEK punctate epithelial keratitis; PEE punctate epithelial erosions; DES dry eye syndrome; MGD meibomian gland dysfunction; ATs artificial tears; PFAT's preservative free artificial tears; NSC nuclear sclerotic cataract; PSC posterior subcapsular cataract; ERM epi-retinal membrane; PVD posterior vitreous detachment; RD retinal detachment; DM diabetes  mellitus; DR diabetic retinopathy; NPDR non-proliferative diabetic retinopathy; PDR proliferative diabetic  retinopathy; CSME clinically significant macular edema; DME diabetic macular edema; dbh dot blot hemorrhages; CWS cotton wool spot; POAG primary open angle glaucoma; C/D cup-to-disc ratio; HVF humphrey visual field; GVF goldmann visual field; OCT optical coherence tomography; IOP intraocular pressure; BRVO Branch retinal vein occlusion; CRVO central retinal vein occlusion; CRAO central retinal artery occlusion; BRAO branch retinal artery occlusion; RT retinal tear; SB scleral buckle; PPV pars plana vitrectomy; VH Vitreous hemorrhage; PRP panretinal laser photocoagulation; IVK intravitreal kenalog ; VMT vitreomacular traction; MH Macular hole;  NVD neovascularization of the disc; NVE neovascularization elsewhere; AREDS age related eye disease study; ARMD age related macular degeneration; POAG primary open angle glaucoma; EBMD epithelial/anterior basement membrane dystrophy; ACIOL anterior chamber intraocular lens; IOL intraocular lens; PCIOL posterior chamber intraocular lens; Phaco/IOL phacoemulsification with intraocular lens placement; PRK photorefractive keratectomy; LASIK laser assisted in situ keratomileusis; HTN hypertension; DM diabetes mellitus; COPD chronic obstructive pulmonary disease

## 2023-12-31 ENCOUNTER — Ambulatory Visit (INDEPENDENT_AMBULATORY_CARE_PROVIDER_SITE_OTHER): Admitting: Ophthalmology

## 2023-12-31 ENCOUNTER — Encounter (INDEPENDENT_AMBULATORY_CARE_PROVIDER_SITE_OTHER): Payer: Self-pay | Admitting: Ophthalmology

## 2023-12-31 DIAGNOSIS — H35372 Puckering of macula, left eye: Secondary | ICD-10-CM

## 2023-12-31 DIAGNOSIS — H04123 Dry eye syndrome of bilateral lacrimal glands: Secondary | ICD-10-CM | POA: Diagnosis not present

## 2023-12-31 DIAGNOSIS — H43821 Vitreomacular adhesion, right eye: Secondary | ICD-10-CM

## 2023-12-31 DIAGNOSIS — H43812 Vitreous degeneration, left eye: Secondary | ICD-10-CM

## 2023-12-31 DIAGNOSIS — H25813 Combined forms of age-related cataract, bilateral: Secondary | ICD-10-CM

## 2024-01-05 DIAGNOSIS — L900 Lichen sclerosus et atrophicus: Secondary | ICD-10-CM | POA: Diagnosis not present

## 2024-01-07 ENCOUNTER — Encounter (INDEPENDENT_AMBULATORY_CARE_PROVIDER_SITE_OTHER): Payer: Self-pay | Admitting: Ophthalmology

## 2024-01-19 NOTE — Progress Notes (Signed)
 [facility] - Clinic Note  02/02/2024     CHIEF COMPLAINT Patient presents for Retina Follow Up   HISTORY OF PRESENT ILLNESS: Alexis Tate is a 73 y.o. female who presents to the clinic today for:   HPI     Retina Follow Up   Patient presents with  Other.  In right eye.  This started 4 weeks ago.  I, the attending physician,  performed the HPI with the patient and updated documentation appropriately.        Comments   Patient here for 4 weeks retina follow up for VMA OD. Patient states vision about like it was. No eye pain. Eyes are dry and watery all the time. Uses Blink.      Last edited by Valdemar Rogue, MD on 02/02/2024  8:13 PM.     Patient states the vision has improved. She is complaining of dry eyes.  Referring physician: Fernand Tracey LABOR, MD [address] [place],  [place] [address]  HISTORICAL INFORMATION:   Selected notes from the MEDICAL RECORD NUMBER Referred by Dr. Laurice for concern of mac hole OS   CURRENT MEDICATIONS: No current outpatient medications on file. (Ophthalmic Drugs)   No current facility-administered medications for this visit. (Ophthalmic Drugs)   Current Outpatient Medications (Other)  Medication Sig   triamcinolone  cream (KENALOG ) 0.1 % Apply 1 application topically. Twice a week   ALPRAZolam (XANAX) 0.5 MG tablet Take 0.5 mg by mouth as needed. (Patient not taking: Reported on 02/02/2024)   mirabegron ER (MYRBETRIQ) 25 MG TB24 tablet Take 25 mg by mouth daily. (Patient not taking: Reported on 02/02/2024)   oxyCODONE -acetaminophen  (PERCOCET/ROXICET) 5-325 MG per tablet Take 2 tablets by mouth every 6 (six) hours as needed for severe pain. (Patient not taking: Reported on 02/02/2024)   No current facility-administered medications for this visit. (Other)   REVIEW OF SYSTEMS: ROS   Positive for: Eyes Negative for: Constitutional, Gastrointestinal, Neurological, Skin, Genitourinary, Musculoskeletal, HENT,  Endocrine, Cardiovascular, Respiratory, Psychiatric, Allergic/Imm, Heme/Lymph Last edited by Orval Asberry RAMAN, COA on 02/02/2024  8:42 AM.     ALLERGIES Allergies  Allergen Reactions   Amoxicillin    Erythromycin    Penicillins    PAST MEDICAL HISTORY Past Medical History:  Diagnosis Date   Asthma    Past Surgical History:  Procedure Laterality Date   FOOT SURGERY     WRIST SURGERY     FAMILY HISTORY Family History  Problem Relation Age of Onset   Diabetes Mother    Diabetes Maternal Aunt    SOCIAL HISTORY Social History   Tobacco Use   Smoking status: Never   Smokeless tobacco: Never  Vaping Use   Vaping status: Never Used  Substance Use Topics   Alcohol use: No   Drug use: No       OPHTHALMIC EXAM:  Base Eye Exam     Visual Acuity (Snellen - Linear)       Right Left   Dist cc 20/30 -2 20/25 -1    Correction: Glasses         Tonometry (Tonopen, 8:39 AM)       Right Left   Pressure 17 16         Pupils       Dark Light Shape React APD   Right 3 2 Round Brisk None   Left 3 2 Round Brisk None         Visual Fields (Counting fingers)  Left Right    Full Full         Extraocular Movement       Right Left    Full, Ortho Full, Ortho         Neuro/Psych     Oriented x3: Yes   Mood/Affect: Normal         Dilation     Both eyes: 1.0% Mydriacyl, 2.5% Phenylephrine @ 8:38 AM           Slit Lamp and Fundus Exam     Slit Lamp Exam       Right Left   Lids/Lashes Dermatochalasis - upper lid Dermatochalasis - upper lid, mild Meibomian gland dysfunction   Conjunctiva/Sclera White and quiet nasal Pigmentation   Cornea trace arcus, trace PEE, mild tear film debris trace arcus, trace PEE, mild tear film debris   Anterior Chamber Deep, narrow temporal angle Deep and quiet, no cell or flare   Iris Round and dilated, focal pigmented spot at 0900 Round and dilated   Lens 2-3+Nuclear sclerosis, 2-3+ Cortical 2-3+Nuclear  sclerosis, 2-3+ Cortical   Anterior Vitreous mild syneresis Vitreous syneresis, Posterior vitreous detachment, Weiss ring, vitreous condensations         Fundus Exam       Right Left   Disc Pink and sharp, mild PPP Pink and sharp, compact, mild PPP   C/D Ratio 0.2 0.1   Macula Flat, Blunted foveal reflex, Retinal pigment epithelial mottling, No heme or edema, mild ERM, peristent central cystic changes Flat, Blunted foveal reflex, ERM, +central cystic changes / schisis, no heme, RPE mottling and clumping   Vessels attenuated, mild tortuosity attenuated, mild tortuosity   Periphery Attached, mild reticular degeneration Attached, mild reticular degeneration, no heme, No RT/RD           Refraction     Wearing Rx       Sphere Cylinder Axis Add   Right +1.00 +1.00 015 +2.50   Left [place] +1.00 166 +2.50           IMAGING AND PROCEDURES  Imaging and Procedures for @TODAY @  OCT, Retina - OU - Both Eyes       Right Eye Quality was good. Central Foveal Thickness: 412. Progression has been stable. Findings include no IRF, no SRF, abnormal foveal contour, epiretinal membrane, vitreomacular adhesion (Stable VMT w/ central cystic changes and trace SRF, mild ERM).   Left Eye Quality was good. Central Foveal Thickness: 373. Progression has been stable. Findings include no SRF, abnormal foveal contour, epiretinal membrane, intraretinal fluid, macular pucker (Persistent ERM and pucker -- stable, persistent central cystic changes / schisis, persistent vit opacities).   Notes *Images captured and stored on drive  Diagnosis / Impression:  NI:Dujaoz VMT w/ central cystic changes and trace SRF, mild ERM OS: Persistent ERM and pucker -- stable, persistent central cystic changes / schisis, persistent vit opacities  Clinical management:  See below  Abbreviations: NFP - Normal foveal profile. CME - cystoid macular edema. PED - pigment epithelial detachment. IRF - intraretinal fluid. SRF -  subretinal fluid. EZ - ellipsoid zone. ERM - epiretinal membrane. ORA - outer retinal atrophy. ORT - outer retinal tubulation. SRHM - subretinal hyper-reflective material           ASSESSMENT/PLAN:    ICD-10-CM   1. Vitreomacular adhesion of right eye  H43.821 OCT, Retina - OU - Both Eyes    2. Posterior vitreous detachment of left eye  H43.812     3.  Epiretinal membrane (ERM) of left eye  H35.372     4. Combined forms of age-related cataract of both eyes  H25.813     5. Dry eyes, bilateral  H04.123      1,2. VMA OD; PVD OS - OCT shows OD: Stable VMT w/ central cystic changes and trace SRF, mild ERM - BCVA OD 20/30 from 20/50, 20/25 from 20/30 OS   - stable PVD OS w/ stable improvement in symptomatic floaters  - Discussed findings and prognosis   - No RT or RD on 360 peripheral exam  - Reviewed s/s of RT/RD  - Strict return precautions for any such RT/RD signs/symptoms  - f/u in 3 months for DFE/OCT  3. Epiretinal membrane, OS  - moderate ERM w/ foveoschisis -- no significant change from prior  - no metamorphopsia - OCT OS shows Persistent ERM and pucker -- stable, persistent central cystic changes / schisis- slightly improved, persistent vit opacities  - BCVA 20/25 from 20/30   - no indication for surgery at this time  - monitor for now  4. Mixed cataract OU  - The symptoms of cataract, surgical options, and treatments and risks were discussed with patient.  - discussed diagnosis and progression  - patient reports significant blurry vision when driving  - under the expert management of Dr. Fleeta  - refer back to Dr. Fleeta for cataract eval  5. Dry eyes OU - recommend artificial tears and lubricating ointment as needed   Ophthalmic Meds Ordered this visit:  No orders of the defined types were placed in this encounter.    Return in about 3 months (around 05/04/2024) for f/u VMA, DFE, OCT.  There are no Patient Instructions on file for this visit.  This document  serves as a record of services personally performed by Redell JUDITHANN Hans, MD, PhD. It was created on their behalf by Avelina Pereyra, COA an ophthalmic technician. The creation of this record is the provider's dictation and/or activities during the visit.   Electronically signed by: Avelina GORMAN Pereyra, COT  02/02/24  8:14 PM   This document serves as a record of services personally performed by Redell JUDITHANN Hans, MD, PhD. It was created on their behalf by Wanda GEANNIE Keens, COT an ophthalmic technician. The creation of this record is the provider's dictation and/or activities during the visit.    Electronically signed by:  Wanda GEANNIE Keens, COT  02/02/24 8:14 PM  Redell JUDITHANN Hans, M.D., Ph.D. Diseases & Surgery of the Retina and Vitreous [facility]  I have reviewed the above documentation for accuracy and completeness, and I agree with the above. Redell JUDITHANN Hans, M.D., Ph.D. 02/02/24 8:15 PM   Abbreviations: M myopia (nearsighted); A astigmatism; H hyperopia (farsighted); P presbyopia; Mrx spectacle prescription;  CTL contact lenses; OD right eye; OS left eye; OU both eyes  XT exotropia; ET esotropia; PEK punctate epithelial keratitis; PEE punctate epithelial erosions; DES dry eye syndrome; MGD meibomian gland dysfunction; ATs artificial tears; PFAT's preservative free artificial tears; NSC nuclear sclerotic cataract; PSC posterior subcapsular cataract; ERM epi-retinal membrane; PVD posterior vitreous detachment; RD retinal detachment; DM diabetes mellitus; DR diabetic retinopathy; NPDR non-proliferative diabetic retinopathy; PDR proliferative diabetic retinopathy; CSME clinically significant macular edema; DME diabetic macular edema; dbh dot blot hemorrhages; CWS cotton wool spot; POAG primary open angle glaucoma; C/D cup-to-disc ratio; HVF humphrey visual field; GVF goldmann visual field; OCT optical coherence tomography; IOP intraocular pressure; BRVO Branch retinal vein  occlusion; CRVO  central retinal vein occlusion; CRAO central retinal artery occlusion; BRAO branch retinal artery occlusion; RT retinal tear; SB scleral buckle; PPV pars plana vitrectomy; VH Vitreous hemorrhage; PRP panretinal laser photocoagulation; IVK intravitreal kenalog ; VMT vitreomacular traction; MH Macular hole;  NVD neovascularization of the disc; NVE neovascularization elsewhere; AREDS age related eye disease study; ARMD age related macular degeneration; POAG primary open angle glaucoma; EBMD epithelial/anterior basement membrane dystrophy; ACIOL anterior chamber intraocular lens; IOL intraocular lens; PCIOL posterior chamber intraocular lens; Phaco/IOL phacoemulsification with intraocular lens placement; PRK photorefractive keratectomy; LASIK laser assisted in situ keratomileusis; HTN hypertension; DM diabetes mellitus; COPD chronic obstructive pulmonary disease

## 2024-02-02 ENCOUNTER — Ambulatory Visit (INDEPENDENT_AMBULATORY_CARE_PROVIDER_SITE_OTHER): Admitting: Ophthalmology

## 2024-02-02 ENCOUNTER — Encounter (INDEPENDENT_AMBULATORY_CARE_PROVIDER_SITE_OTHER): Payer: Self-pay | Admitting: Ophthalmology

## 2024-02-02 DIAGNOSIS — H43812 Vitreous degeneration, left eye: Secondary | ICD-10-CM | POA: Diagnosis not present

## 2024-02-02 DIAGNOSIS — H43821 Vitreomacular adhesion, right eye: Secondary | ICD-10-CM

## 2024-02-02 DIAGNOSIS — H35372 Puckering of macula, left eye: Secondary | ICD-10-CM

## 2024-02-02 DIAGNOSIS — H25813 Combined forms of age-related cataract, bilateral: Secondary | ICD-10-CM

## 2024-02-02 DIAGNOSIS — H04123 Dry eye syndrome of bilateral lacrimal glands: Secondary | ICD-10-CM

## 2024-02-09 DIAGNOSIS — L900 Lichen sclerosus et atrophicus: Secondary | ICD-10-CM | POA: Diagnosis not present

## 2024-02-16 DIAGNOSIS — H524 Presbyopia: Secondary | ICD-10-CM | POA: Diagnosis not present

## 2024-02-16 DIAGNOSIS — H35373 Puckering of macula, bilateral: Secondary | ICD-10-CM | POA: Diagnosis not present

## 2024-02-16 DIAGNOSIS — H43821 Vitreomacular adhesion, right eye: Secondary | ICD-10-CM | POA: Diagnosis not present

## 2024-02-16 DIAGNOSIS — H25813 Combined forms of age-related cataract, bilateral: Secondary | ICD-10-CM | POA: Diagnosis not present

## 2024-03-24 ENCOUNTER — Encounter (INDEPENDENT_AMBULATORY_CARE_PROVIDER_SITE_OTHER): Admitting: Ophthalmology

## 2024-03-24 ENCOUNTER — Encounter (INDEPENDENT_AMBULATORY_CARE_PROVIDER_SITE_OTHER): Payer: Self-pay

## 2024-05-03 ENCOUNTER — Encounter (INDEPENDENT_AMBULATORY_CARE_PROVIDER_SITE_OTHER): Admitting: Ophthalmology
